# Patient Record
Sex: Male | Born: 1945
Health system: Southern US, Community
[De-identification: ages and names within clinical notes are randomized; demographics above are authoritative.]

## PROBLEM LIST (undated history)

## (undated) DIAGNOSIS — F329 Major depressive disorder, single episode, unspecified: Secondary | ICD-10-CM

## (undated) DIAGNOSIS — F419 Anxiety disorder, unspecified: Secondary | ICD-10-CM

## (undated) DIAGNOSIS — M1990 Unspecified osteoarthritis, unspecified site: Secondary | ICD-10-CM

## (undated) DIAGNOSIS — F32A Depression, unspecified: Secondary | ICD-10-CM

## (undated) DIAGNOSIS — K219 Gastro-esophageal reflux disease without esophagitis: Secondary | ICD-10-CM

## (undated) HISTORY — PX: TONSILLECTOMY: SUR1361

---
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
## 1982-01-29 HISTORY — PX: KNEE ARTHROSCOPY: SUR90

## 1999-04-05 ENCOUNTER — Encounter: Payer: Self-pay | Admitting: Family Medicine

## 1999-04-05 ENCOUNTER — Encounter: Admission: RE | Admit: 1999-04-05 | Discharge: 1999-04-05 | Payer: Self-pay | Admitting: Family Medicine

## 2010-01-29 HISTORY — PX: EYE SURGERY: SHX253

## 2010-01-29 HISTORY — PX: VEIN LIGATION AND STRIPPING: SHX2653

## 2010-01-29 HISTORY — PX: TURBINATE REDUCTION: SHX6157

## 2012-08-28 ENCOUNTER — Other Ambulatory Visit: Payer: Self-pay | Admitting: Orthopedic Surgery

## 2012-09-03 ENCOUNTER — Encounter (HOSPITAL_COMMUNITY): Payer: Self-pay | Admitting: Pharmacy Technician

## 2012-09-04 NOTE — Pre-Procedure Instructions (Signed)
Torell Minder  09/04/2012   Your procedure is scheduled on:  Monday, August 18th.  Report to [facility] at 5:30 AM.  Call this number if you have problems the morning of surgery: [phone]   Remember:   Do not eat food or drink liquids after midnight.   Take these medicines the morning of surgery with A SIP OF WATER: sertraline (ZOLOFT). Take if needed:HYDROcodone-acetaminophen (NORCO).   On Monday, August 11th, Stop taking Aspirin, Coumadin, Plavix, Effient and Herbal medications GLUCOSAMINE-CHONDROITIN.   Do not take any NSAIDs ie: Ibuprofen,  Advil,Naproxen or any medication containing Aspirin.     Do not wear jewelry, make-up or nail polish.  Do not wear lotions, powders, or perfumes. You may wear deodorant.  Do not shave 48 hours prior to surgery. Men may shave face and neck.  Do not bring valuables to the hospital.  [facility] is not responsible for any belongings or valuables.  Contacts, dentures or bridgework may not be worn into surgery.  Leave suitcase in the car. After surgery it may be brought to your room.  For patients admitted to the hospital, checkout time is 11:00 AM the day of discharge.     Special Instructions: Shower using CHG 2 nights before surgery and the night before surgery.  If you shower the day of surgery use CHG.  Use special wash - you have one bottle of CHG for all showers.  You should use approximately 1/3 of the bottle for each shower.   Please read over the following fact sheets that you were given: Pain Booklet, Coughing and Deep Breathing, Blood Transfusion Information and Surgical Site Infection Prevention

## 2012-09-05 ENCOUNTER — Encounter (HOSPITAL_COMMUNITY)
Admission: RE | Admit: 2012-09-05 | Discharge: 2012-09-05 | Disposition: A | Discharge status: Home / Self Care | Payer: Commercial Managed Care - PPO | Source: Ambulatory Visit | Attending: Orthopedic Surgery | Admitting: Orthopedic Surgery

## 2012-09-05 ENCOUNTER — Encounter (HOSPITAL_COMMUNITY): Payer: Self-pay

## 2012-09-05 DIAGNOSIS — Z01812 Encounter for preprocedural laboratory examination: Secondary | ICD-10-CM | POA: Insufficient documentation

## 2012-09-05 DIAGNOSIS — Z01818 Encounter for other preprocedural examination: Secondary | ICD-10-CM | POA: Insufficient documentation

## 2012-09-05 DIAGNOSIS — Z01810 Encounter for preprocedural cardiovascular examination: Secondary | ICD-10-CM | POA: Insufficient documentation

## 2012-09-05 HISTORY — DX: Unspecified osteoarthritis, unspecified site: M19.90

## 2012-09-05 LAB — COMPREHENSIVE METABOLIC PANEL
AST: 17 U/L (ref 0–37)
CO2: 28 mEq/L (ref 19–32)
Calcium: 9.7 mg/dL (ref 8.4–10.5)
Creatinine, Ser: 0.99 mg/dL (ref 0.50–1.35)
GFR calc Af Amer: >90 mL/min (ref >90)
GFR calc non Af Amer: 85 mL/min — ABNORMAL LOW (ref >90)

## 2012-09-05 LAB — CBC WITH DIFFERENTIAL/PLATELET
Basophils Absolute: 0 10*3/uL (ref 0.0–0.1)
Eosinophils Relative: 2 % (ref 0–5)
HCT: 43.2 % (ref 39.0–52.0)
Lymphocytes Relative: 23 % (ref 12–46)
MCH: 30.3 pg (ref 26.0–34.0)
MCHC: 35.2 g/dL (ref 30.0–36.0)
MCV: 86.2 fL (ref 78.0–100.0)
Monocytes Absolute: 0.6 10*3/uL (ref 0.1–1.0)
RDW: 12.6 % (ref 11.5–15.5)

## 2012-09-05 LAB — SURGICAL PCR SCREEN
MRSA, PCR: NEGATIVE
Staphylococcus aureus: NEGATIVE

## 2012-09-05 LAB — URINALYSIS, ROUTINE W REFLEX MICROSCOPIC
Ketones, ur: NEGATIVE mg/dL
Leukocytes, UA: NEGATIVE
Nitrite: NEGATIVE
Protein, ur: NEGATIVE mg/dL
Urobilinogen, UA: 1 mg/dL (ref 0.0–1.0)
pH: 6 (ref 5.0–8.0)

## 2012-09-05 LAB — PROTIME-INR: INR: 0.99 (ref 0.00–1.49)

## 2012-09-14 MED ORDER — DEXTROSE 5 % IV SOLN
3.0000 g | INTRAVENOUS | Status: AC
  Administered 2012-09-15: 3 g via INTRAVENOUS
  Filled 2012-09-14: qty 3000

## 2012-09-15 ENCOUNTER — Encounter (HOSPITAL_COMMUNITY): Payer: Self-pay | Admitting: Anesthesiology

## 2012-09-15 ENCOUNTER — Encounter (HOSPITAL_COMMUNITY)
Admission: RE | Disposition: A | Discharge status: Home Health | Payer: Self-pay | Source: Ambulatory Visit | Attending: Orthopedic Surgery

## 2012-09-15 ENCOUNTER — Inpatient Hospital Stay (HOSPITAL_COMMUNITY)
Admission: RE | Admit: 2012-09-15 | Discharge: 2012-09-17 | DRG: 470 | Disposition: A | Discharge status: Home Health | Payer: Commercial Managed Care - PPO | Source: Ambulatory Visit | Attending: Orthopedic Surgery | Admitting: Orthopedic Surgery

## 2012-09-15 ENCOUNTER — Encounter (HOSPITAL_COMMUNITY): Payer: Self-pay | Admitting: *Deleted

## 2012-09-15 ENCOUNTER — Inpatient Hospital Stay (HOSPITAL_COMMUNITY): Payer: Commercial Managed Care - PPO | Admitting: Anesthesiology

## 2012-09-15 DIAGNOSIS — Z7901 Long term (current) use of anticoagulants: Secondary | ICD-10-CM

## 2012-09-15 DIAGNOSIS — M1710 Unilateral primary osteoarthritis, unspecified knee: Principal | ICD-10-CM | POA: Diagnosis present

## 2012-09-15 DIAGNOSIS — Z79899 Other long term (current) drug therapy: Secondary | ICD-10-CM

## 2012-09-15 DIAGNOSIS — Z96651 Presence of right artificial knee joint: Secondary | ICD-10-CM

## 2012-09-15 DIAGNOSIS — D62 Acute posthemorrhagic anemia: Secondary | ICD-10-CM | POA: Diagnosis not present

## 2012-09-15 HISTORY — PX: TOTAL KNEE ARTHROPLASTY: SHX125

## 2012-09-15 LAB — CBC
MCH: 30 pg (ref 26.0–34.0)
MCV: 85.8 fL (ref 78.0–100.0)
Platelets: 174 10*3/uL (ref 150–400)
RBC: 4.43 MIL/uL (ref 4.22–5.81)

## 2012-09-15 SURGERY — ARTHROPLASTY, KNEE, TOTAL
Anesthesia: Regional | Site: Knee | Laterality: Right | Wound class: Clean

## 2012-09-15 MED ORDER — SENNOSIDES-DOCUSATE SODIUM 8.6-50 MG PO TABS: 1.0000 | ORAL_TABLET | Freq: Every evening | ORAL | Status: DC | PRN

## 2012-09-15 MED ORDER — 0.9 % SODIUM CHLORIDE (POUR BTL) OPTIME
TOPICAL | Status: DC | PRN
  Administered 2012-09-15: 1000 mL

## 2012-09-15 MED ORDER — MIDAZOLAM HCL 2 MG/2ML IJ SOLN
INTRAMUSCULAR | Status: AC
  Administered 2012-09-15: 2 mg via INTRAVENOUS
  Filled 2012-09-15: qty 2

## 2012-09-15 MED ORDER — PROPOFOL 10 MG/ML IV BOLUS
INTRAVENOUS | Status: DC | PRN
  Administered 2012-09-15: 200 mg via INTRAVENOUS

## 2012-09-15 MED ORDER — MIDAZOLAM HCL 2 MG/2ML IJ SOLN: 1.0000 mg | INTRAMUSCULAR | Status: DC | PRN

## 2012-09-15 MED ORDER — PHENYLEPHRINE HCL 10 MG/ML IJ SOLN
INTRAMUSCULAR | Status: DC | PRN
  Administered 2012-09-15: 120 ug via INTRAVENOUS
  Administered 2012-09-15 (×6): 80 ug via INTRAVENOUS

## 2012-09-15 MED ORDER — HYDROMORPHONE HCL PF 1 MG/ML IJ SOLN
1.0000 mg | INTRAMUSCULAR | Status: DC | PRN
  Administered 2012-09-15 – 2012-09-17 (×9): 1 mg via INTRAVENOUS
  Filled 2012-09-15 (×9): qty 1

## 2012-09-15 MED ORDER — ENOXAPARIN SODIUM 30 MG/0.3ML ~~LOC~~ SOLN
30.0000 mg | Freq: Two times a day (BID) | SUBCUTANEOUS | Status: DC
  Administered 2012-09-16 – 2012-09-17 (×3): 30 mg via SUBCUTANEOUS
  Filled 2012-09-15 (×5): qty 0.3

## 2012-09-15 MED ORDER — CEFAZOLIN SODIUM-DEXTROSE 2-3 GM-% IV SOLR
2.0000 g | Freq: Four times a day (QID) | INTRAVENOUS | Status: AC
  Administered 2012-09-15 (×2): 2 g via INTRAVENOUS
  Filled 2012-09-15 (×2): qty 50

## 2012-09-15 MED ORDER — CELECOXIB 200 MG PO CAPS
200.0000 mg | ORAL_CAPSULE | Freq: Two times a day (BID) | ORAL | Status: DC
  Administered 2012-09-15 – 2012-09-16 (×4): 200 mg via ORAL
  Filled 2012-09-15 (×6): qty 1

## 2012-09-15 MED ORDER — DIPHENHYDRAMINE HCL 12.5 MG/5ML PO ELIX: 12.5000 mg | ORAL_SOLUTION | ORAL | Status: DC | PRN

## 2012-09-15 MED ORDER — MEPERIDINE HCL 25 MG/ML IJ SOLN: 6.2500 mg | INTRAMUSCULAR | Status: DC | PRN

## 2012-09-15 MED ORDER — OXYCODONE HCL 5 MG PO TABS
ORAL_TABLET | ORAL | Status: AC
  Administered 2012-09-15: 5 mg
  Filled 2012-09-15: qty 1

## 2012-09-15 MED ORDER — OXYCODONE HCL 5 MG PO TABS
5.0000 mg | ORAL_TABLET | ORAL | Status: DC | PRN
  Administered 2012-09-15 – 2012-09-17 (×12): 10 mg via ORAL
  Filled 2012-09-15 (×12): qty 2

## 2012-09-15 MED ORDER — MIDAZOLAM HCL 2 MG/2ML IJ SOLN: 0.5000 mg | Freq: Once | INTRAMUSCULAR | Status: DC | PRN

## 2012-09-15 MED ORDER — METHOCARBAMOL 500 MG PO TABS
ORAL_TABLET | ORAL | Status: AC
  Administered 2012-09-15: 500 mg
  Filled 2012-09-15: qty 1

## 2012-09-15 MED ORDER — MIDAZOLAM HCL 5 MG/5ML IJ SOLN
INTRAMUSCULAR | Status: DC | PRN
  Administered 2012-09-15: 2 mg via INTRAVENOUS

## 2012-09-15 MED ORDER — HYDROMORPHONE HCL PF 1 MG/ML IJ SOLN
INTRAMUSCULAR | Status: AC
  Filled 2012-09-15: qty 1

## 2012-09-15 MED ORDER — ONDANSETRON HCL 4 MG PO TABS: 4.0000 mg | ORAL_TABLET | Freq: Four times a day (QID) | ORAL | Status: DC | PRN

## 2012-09-15 MED ORDER — ACETAMINOPHEN 650 MG RE SUPP: 650.0000 mg | Freq: Four times a day (QID) | RECTAL | Status: DC | PRN

## 2012-09-15 MED ORDER — SODIUM CHLORIDE 0.9 % IV SOLN: INTRAVENOUS | Status: DC

## 2012-09-15 MED ORDER — BUPIVACAINE ON-Q PAIN PUMP (FOR ORDER SET NO CHG)
INJECTION | Status: DC
  Filled 2012-09-15: qty 1

## 2012-09-15 MED ORDER — CELECOXIB 200 MG PO CAPS
ORAL_CAPSULE | ORAL | Status: AC
  Filled 2012-09-15: qty 1

## 2012-09-15 MED ORDER — ACETAMINOPHEN 325 MG PO TABS: 650.0000 mg | ORAL_TABLET | Freq: Four times a day (QID) | ORAL | Status: DC | PRN

## 2012-09-15 MED ORDER — FENTANYL CITRATE 0.05 MG/ML IJ SOLN: 50.0000 ug | INTRAMUSCULAR | Status: DC | PRN

## 2012-09-15 MED ORDER — BUPIVACAINE HCL (PF) 0.5 % IJ SOLN
INTRAMUSCULAR | Status: AC
  Filled 2012-09-15: qty 30

## 2012-09-15 MED ORDER — SODIUM CHLORIDE 0.9 % IJ SOLN
INTRAMUSCULAR | Status: DC | PRN
  Administered 2012-09-15: 10:00:00

## 2012-09-15 MED ORDER — ONDANSETRON HCL 4 MG/2ML IJ SOLN
INTRAMUSCULAR | Status: AC
  Administered 2012-09-15: 4 mg
  Filled 2012-09-15: qty 2

## 2012-09-15 MED ORDER — FENTANYL CITRATE 0.05 MG/ML IJ SOLN
INTRAMUSCULAR | Status: DC | PRN
  Administered 2012-09-15: 25 ug via INTRAVENOUS
  Administered 2012-09-15: 50 ug via INTRAVENOUS
  Administered 2012-09-15 (×3): 25 ug via INTRAVENOUS

## 2012-09-15 MED ORDER — SODIUM CHLORIDE 0.9 % IR SOLN
Status: DC | PRN
  Administered 2012-09-15: 3000 mL

## 2012-09-15 MED ORDER — ONDANSETRON HCL 4 MG/2ML IJ SOLN
INTRAMUSCULAR | Status: DC | PRN
  Administered 2012-09-15: 4 mg via INTRAVENOUS

## 2012-09-15 MED ORDER — LIDOCAINE HCL (CARDIAC) 20 MG/ML IV SOLN
INTRAVENOUS | Status: DC | PRN
  Administered 2012-09-15: 100 mg via INTRAVENOUS

## 2012-09-15 MED ORDER — BUPIVACAINE-EPINEPHRINE PF 0.25-1:200000 % IJ SOLN
INTRAMUSCULAR | Status: AC
  Filled 2012-09-15: qty 30

## 2012-09-15 MED ORDER — BUPIVACAINE HCL (PF) 0.5 % IJ SOLN
INTRAMUSCULAR | Status: DC | PRN
  Administered 2012-09-15: 30 mL

## 2012-09-15 MED ORDER — OXYCODONE HCL 5 MG/5ML PO SOLN: 5.0000 mg | Freq: Once | ORAL | Status: DC | PRN

## 2012-09-15 MED ORDER — DOCUSATE SODIUM 100 MG PO CAPS
100.0000 mg | ORAL_CAPSULE | Freq: Two times a day (BID) | ORAL | Status: DC
  Administered 2012-09-15 – 2012-09-16 (×3): 100 mg via ORAL
  Filled 2012-09-15 (×5): qty 1

## 2012-09-15 MED ORDER — LACTATED RINGERS IV SOLN
INTRAVENOUS | Status: DC
  Administered 2012-09-15 (×2): via INTRAVENOUS

## 2012-09-15 MED ORDER — SERTRALINE HCL 50 MG PO TABS
50.0000 mg | ORAL_TABLET | Freq: Every morning | ORAL | Status: DC
  Administered 2012-09-16: 50 mg via ORAL
  Filled 2012-09-15 (×3): qty 1

## 2012-09-15 MED ORDER — METOCLOPRAMIDE HCL 5 MG/ML IJ SOLN: 5.0000 mg | Freq: Three times a day (TID) | INTRAMUSCULAR | Status: DC | PRN

## 2012-09-15 MED ORDER — PHENOL 1.4 % MT LIQD: 1.0000 | OROMUCOSAL | Status: DC | PRN

## 2012-09-15 MED ORDER — ACETAMINOPHEN 500 MG PO TABS
1000.0000 mg | ORAL_TABLET | Freq: Once | ORAL | Status: AC
  Administered 2012-09-15: 1000 mg via ORAL
  Filled 2012-09-15: qty 2

## 2012-09-15 MED ORDER — METHOCARBAMOL 100 MG/ML IJ SOLN
500.0000 mg | Freq: Four times a day (QID) | INTRAVENOUS | Status: DC | PRN
  Filled 2012-09-15: qty 5

## 2012-09-15 MED ORDER — FENTANYL CITRATE 0.05 MG/ML IJ SOLN
INTRAMUSCULAR | Status: AC
  Administered 2012-09-15: 100 ug via INTRAVENOUS
  Filled 2012-09-15: qty 2

## 2012-09-15 MED ORDER — MENTHOL 3 MG MT LOZG: 1.0000 | LOZENGE | OROMUCOSAL | Status: DC | PRN

## 2012-09-15 MED ORDER — SODIUM CHLORIDE 0.9 % IV SOLN
INTRAVENOUS | Status: DC
  Administered 2012-09-15 – 2012-09-16 (×2): via INTRAVENOUS

## 2012-09-15 MED ORDER — HYDROMORPHONE HCL PF 1 MG/ML IJ SOLN
0.2500 mg | INTRAMUSCULAR | Status: DC | PRN
  Administered 2012-09-15: 1 mg via INTRAVENOUS
  Administered 2012-09-15 (×2): 0.5 mg via INTRAVENOUS

## 2012-09-15 MED ORDER — FLEET ENEMA 7-19 GM/118ML RE ENEM: 1.0000 | ENEMA | Freq: Once | RECTAL | Status: AC | PRN

## 2012-09-15 MED ORDER — ONDANSETRON HCL 4 MG/2ML IJ SOLN: 4.0000 mg | Freq: Four times a day (QID) | INTRAMUSCULAR | Status: DC | PRN

## 2012-09-15 MED ORDER — OXYCODONE HCL 5 MG PO TABS: 5.0000 mg | ORAL_TABLET | Freq: Once | ORAL | Status: DC | PRN

## 2012-09-15 MED ORDER — METOCLOPRAMIDE HCL 10 MG PO TABS: 5.0000 mg | ORAL_TABLET | Freq: Three times a day (TID) | ORAL | Status: DC | PRN

## 2012-09-15 MED ORDER — BUPIVACAINE-EPINEPHRINE PF 0.5-1:200000 % IJ SOLN
INTRAMUSCULAR | Status: DC | PRN
  Administered 2012-09-15: 30 mL

## 2012-09-15 MED ORDER — METHOCARBAMOL 500 MG PO TABS
500.0000 mg | ORAL_TABLET | Freq: Four times a day (QID) | ORAL | Status: DC | PRN
  Administered 2012-09-15 – 2012-09-17 (×5): 500 mg via ORAL
  Filled 2012-09-15 (×6): qty 1

## 2012-09-15 MED ORDER — ZOLPIDEM TARTRATE 5 MG PO TABS: 5.0000 mg | ORAL_TABLET | Freq: Every evening | ORAL | Status: DC | PRN

## 2012-09-15 MED ORDER — OXYCODONE HCL ER 20 MG PO T12A
20.0000 mg | EXTENDED_RELEASE_TABLET | Freq: Two times a day (BID) | ORAL | Status: DC
  Administered 2012-09-15 – 2012-09-16 (×4): 20 mg via ORAL
  Filled 2012-09-15 (×4): qty 1

## 2012-09-15 MED ORDER — BUPIVACAINE LIPOSOME 1.3 % IJ SUSP
20.0000 mL | Freq: Once | INTRAMUSCULAR | Status: DC
  Filled 2012-09-15: qty 20

## 2012-09-15 MED ORDER — ALUM & MAG HYDROXIDE-SIMETH 200-200-20 MG/5ML PO SUSP: 30.0000 mL | ORAL | Status: DC | PRN

## 2012-09-15 MED ORDER — PROMETHAZINE HCL 25 MG/ML IJ SOLN: 6.2500 mg | INTRAMUSCULAR | Status: DC | PRN

## 2012-09-15 MED ORDER — TRANEXAMIC ACID 100 MG/ML IV SOLN
1000.0000 mg | INTRAVENOUS | Status: AC
  Administered 2012-09-15: 1000 mg via INTRAVENOUS
  Filled 2012-09-15: qty 10

## 2012-09-15 MED ORDER — OXYCODONE HCL ER 20 MG PO T12A: 20.0000 mg | EXTENDED_RELEASE_TABLET | Freq: Two times a day (BID) | ORAL | Status: DC

## 2012-09-15 MED ORDER — BISACODYL 5 MG PO TBEC: 5.0000 mg | DELAYED_RELEASE_TABLET | Freq: Every day | ORAL | Status: DC | PRN

## 2012-09-15 SURGICAL SUPPLY — 55 items
BANDAGE ESMARK 6X9 LF (GAUZE/BANDAGES/DRESSINGS) ×1 IMPLANT
BLADE SAGITTAL 13X1.27X60 (BLADE) ×2 IMPLANT
BLADE SAW SGTL 83.5X18.5 (BLADE) ×2 IMPLANT
BNDG ESMARK 6X9 LF (GAUZE/BANDAGES/DRESSINGS) ×2
BOWL SMART MIX CTS (DISPOSABLE) ×2 IMPLANT
CAP POR TM CP VIT E LN CER HD ×2 IMPLANT
CEMENT BONE SIMPLEX SPEEDSET (Cement) ×4 IMPLANT
CLOTH BEACON ORANGE TIMEOUT ST (SAFETY) ×2 IMPLANT
COVER SURGICAL LIGHT HANDLE (MISCELLANEOUS) ×2 IMPLANT
CUFF TOURNIQUET SINGLE 34IN LL (TOURNIQUET CUFF) ×2 IMPLANT
DRAPE EXTREMITY T 121X128X90 (DRAPE) ×2 IMPLANT
DRAPE INCISE IOBAN 66X45 STRL (DRAPES) ×4 IMPLANT
DRAPE PROXIMA HALF (DRAPES) ×2 IMPLANT
DRAPE U-SHAPE 47X51 STRL (DRAPES) ×2 IMPLANT
DRSG ADAPTIC 3X8 NADH LF (GAUZE/BANDAGES/DRESSINGS) ×2 IMPLANT
DRSG PAD ABDOMINAL 8X10 ST (GAUZE/BANDAGES/DRESSINGS) ×2 IMPLANT
DURAPREP 26ML APPLICATOR (WOUND CARE) ×4 IMPLANT
ELECT REM PT RETURN 9FT ADLT (ELECTROSURGICAL) ×2
ELECTRODE REM PT RTRN 9FT ADLT (ELECTROSURGICAL) ×1 IMPLANT
EVACUATOR 1/8 PVC DRAIN (DRAIN) ×2 IMPLANT
GLOVE BIOGEL M 7.0 STRL (GLOVE) IMPLANT
GLOVE BIOGEL PI IND STRL 7.5 (GLOVE) IMPLANT
GLOVE BIOGEL PI IND STRL 8.5 (GLOVE) ×2 IMPLANT
GLOVE BIOGEL PI INDICATOR 7.5 (GLOVE)
GLOVE BIOGEL PI INDICATOR 8.5 (GLOVE) ×2
GLOVE SURG ORTHO 8.0 STRL STRW (GLOVE) ×4 IMPLANT
GOWN PREVENTION PLUS XLARGE (GOWN DISPOSABLE) ×4 IMPLANT
GOWN STRL NON-REIN LRG LVL3 (GOWN DISPOSABLE) ×4 IMPLANT
HANDPIECE INTERPULSE COAX TIP (DISPOSABLE) ×1
HOOD PEEL AWAY FACE SHEILD DIS (HOOD) ×8 IMPLANT
KIT BASIN OR (CUSTOM PROCEDURE TRAY) ×2 IMPLANT
KIT ROOM TURNOVER OR (KITS) ×2 IMPLANT
MANIFOLD NEPTUNE II (INSTRUMENTS) ×2 IMPLANT
NEEDLE 22X1 1/2 (OR ONLY) (NEEDLE) ×2 IMPLANT
NEEDLE HYPO 21X1.5 SAFETY (NEEDLE) ×2 IMPLANT
NS IRRIG 1000ML POUR BTL (IV SOLUTION) ×2 IMPLANT
PACK TOTAL JOINT (CUSTOM PROCEDURE TRAY) ×2 IMPLANT
PAD ARMBOARD 7.5X6 YLW CONV (MISCELLANEOUS) ×4 IMPLANT
PADDING CAST COTTON 6X4 STRL (CAST SUPPLIES) ×2 IMPLANT
POSITIONER HEAD PRONE TRACH (MISCELLANEOUS) ×2 IMPLANT
SET HNDPC FAN SPRY TIP SCT (DISPOSABLE) ×1 IMPLANT
SPONGE GAUZE 4X4 12PLY (GAUZE/BANDAGES/DRESSINGS) ×2 IMPLANT
STAPLER VISISTAT 35W (STAPLE) ×2 IMPLANT
SUCTION FRAZIER TIP 10 FR DISP (SUCTIONS) ×2 IMPLANT
SUT BONE WAX W31G (SUTURE) ×2 IMPLANT
SUT VIC AB 0 CTB1 27 (SUTURE) ×4 IMPLANT
SUT VIC AB 1 CT1 27 (SUTURE) ×2
SUT VIC AB 1 CT1 27XBRD ANBCTR (SUTURE) ×2 IMPLANT
SUT VIC AB 2-0 CT1 27 (SUTURE) ×2
SUT VIC AB 2-0 CT1 TAPERPNT 27 (SUTURE) ×2 IMPLANT
SYR CONTROL 10ML LL (SYRINGE) ×2 IMPLANT
TOWEL OR 17X24 6PK STRL BLUE (TOWEL DISPOSABLE) ×2 IMPLANT
TOWEL OR 17X26 10 PK STRL BLUE (TOWEL DISPOSABLE) ×2 IMPLANT
TRAY FOLEY CATH 16FRSI W/METER (SET/KITS/TRAYS/PACK) ×2 IMPLANT
WATER STERILE IRR 1000ML POUR (IV SOLUTION) ×4 IMPLANT

## 2012-09-15 NOTE — Evaluation (Signed)
Physical Therapy Evaluation Patient Details Name: Ronald Chaney MRN: [id number] DOB: 20-Feb-1945 Today's Date: 09/15/2012 Time: 4403-4742 PT Time Calculation (min): 23 min  PT Assessment / Plan / Recommendation History of Present Illness  Patient is a 67 yo male s/p Rt TKA.  Clinical Impression  Patient presents with problems listed below.  Patient will benefit from acute PT to maximize independence prior to discharge home with wife.    PT Assessment  Patient needs continued PT services    Follow Up Recommendations  Home health PT;Supervision/Assistance - 24 hour    Does the patient have the potential to tolerate intense rehabilitation      Barriers to Discharge        Equipment Recommendations  None recommended by PT    Recommendations for Other Services     Frequency 7X/week    Precautions / Restrictions Precautions Precautions: Knee Precaution Booklet Issued: Yes (comment) Precaution Comments: Reviewed precautions with patient and wife. Restrictions Weight Bearing Restrictions: Yes RLE Weight Bearing: Weight bearing as tolerated   Pertinent Vitals/Pain       Mobility  Bed Mobility Bed Mobility: Supine to Sit;Sitting - Scoot to Edge of Bed Supine to Sit: 4: Min assist;HOB flat Sitting - Scoot to [place] of Bed: 4: Min guard Details for Bed Mobility Assistance: Verbal cues for technique.  Assist to move RLE off of bed.  Cues to slow down for safety. Transfers Transfers: Sit to Stand;Stand to [employer] Transfers Sit to Stand: 4: Min assist;With upper extremity assist;From bed Stand to Sit: 4: Min assist;With upper extremity assist;With armrests;To chair/3-in-1 Stand Pivot Transfers: 4: Min assist Details for Transfer Assistance: Verbal cues for hand placement and technique.  Assist to rise to standing and for safety.  Patient able to take several steps to pivot to chair.  Cues to use UE's on RW to hold weight with RLE buckling.  Cues for hand placement to move  to sitting. Ambulation/Gait Ambulation/Gait Assistance: Not tested (comment)    Exercises Total Joint Exercises Ankle Circles/Pumps: AROM;Both;10 reps;Seated   PT Diagnosis: Difficulty walking;Acute pain  PT Problem List: Decreased strength;Decreased range of motion;Decreased activity tolerance;Decreased balance;Decreased knowledge of use of DME;Decreased knowledge of precautions;Pain PT Treatment Interventions: DME instruction;Gait training;Stair training;Functional mobility training;Therapeutic exercise;Patient/family education     PT Goals(Current goals can be found in the care plan section) Acute Rehab PT Goals Patient Stated Goal: To go home tomorrow PT Goal Formulation: With patient/family Time For Goal Achievement: 09/22/12 Potential to Achieve Goals: Good  Visit Information  Last PT Received On: 09/15/12 Assistance Needed: +1 History of Present Illness: Patient is a 67 yo male s/p Rt TKA.       Prior Functioning  Home Living Family/patient expects to be discharged to:: Private residence Living Arrangements: Spouse/significant other Available Help at Discharge: Family;Available 24 hours/day Type of Home: House Home Access: Stairs to enter [employer] of Steps: 12 (2 flights of 6 steps with landing to get to main floor) Entrance Stairs-Rails: Right Home Layout: Two level;Able to live on main level with bedroom/bathroom Home Equipment: Dan Humphreys - 2 wheels;Bedside commode Prior Function Level of Independence: Independent Communication Communication: No difficulties    Cognition  Cognition Arousal/Alertness: Awake/alert Behavior During Therapy: WFL for tasks assessed/performed (Slightly impulsive - cues to slow down) Overall Cognitive Status: Within Functional Limits for tasks assessed    Extremity/Trunk Assessment Upper Extremity Assessment Upper Extremity Assessment: Overall WFL for tasks assessed Lower Extremity Assessment Lower Extremity Assessment:  RLE deficits/detail RLE Deficits / Details:  Decreased strength and ROM due to surgery RLE: Unable to fully assess due to pain Cervical / Trunk Assessment Cervical / Trunk Assessment: Normal   Balance    End of Session PT - End of Session Equipment Utilized During Treatment: Gait belt Activity Tolerance: Patient limited by fatigue Patient left: in chair;with call bell/phone within reach;with family/visitor present Nurse Communication: Mobility status CPM Right Knee CPM Right Knee: Off (off at 15:55)  GP     Vena Austria 09/15/2012, 4:24 PM Durenda Hurt. Renaldo Fiddler, [facility] Pager [phone]

## 2012-09-15 NOTE — Anesthesia Procedure Notes (Addendum)
Anesthesia Regional Block:  Femoral nerve block  Pre-Anesthetic Checklist: ,, timeout performed, Correct Patient, Correct Site, Correct Laterality, Correct Procedure, Correct Position, site marked, Risks and benefits discussed,  Surgical consent,  Pre-op evaluation,  At surgeon's request and post-op pain management  Laterality: Right  Prep: chloraprep       Needles:  Injection technique: Single-shot  Needle Type: Stimulator Needle - 40     Needle Length: 4cm  Needle Gauge: 22 and 22 G    Additional Needles:  Procedures: nerve stimulator Femoral nerve block  Nerve Stimulator or Paresthesia:  Response: patella twitch, 0.44 mA, 0.1 ms,   Additional Responses:   Narrative:  Start time: 09/15/2012 8:11 AM End time: 09/15/2012 8:15 AM Injection made incrementally with aspirations every 5 mL.  Performed by: Personally  Anesthesiologist: Sandford Craze, MD  Additional Notes: Pt identified in Holding room.  Monitors applied. Working IV access confirmed. Sterile prep R groin.  #22ga PNS to patella twitch at 0.87mA threshold.  30cc 0.5% Bupivacaine with 1:200k epi injected incrementally after negative test dose.  Patient asymptomatic, VSS, no heme aspirated, tolerated well.  Sandford Craze, MD  Femoral nerve block Procedure Name: LMA Insertion Date/Time: 09/15/2012 9:12 AM Performed by: Jerilee Hoh Pre-anesthesia Checklist: Patient identified, Emergency Drugs available, Suction available and Patient being monitored Patient Re-evaluated:Patient Re-evaluated prior to inductionOxygen Delivery Method: Circle system utilized Preoxygenation: Pre-oxygenation with 100% oxygen Intubation Type: IV induction Ventilation: Mask ventilation without difficulty LMA: LMA inserted LMA Size: 4.0 Number of attempts: 1 Placement Confirmation: positive ETCO2 and breath sounds checked- equal and bilateral Tube secured with: Tape Dental Injury: Teeth and Oropharynx as per pre-operative assessment

## 2012-09-15 NOTE — Progress Notes (Signed)
UR COMPLETED  

## 2012-09-15 NOTE — Preoperative (Signed)
Beta Blockers   Reason not to administer Beta Blockers:Not Applicable 

## 2012-09-15 NOTE — Transfer of Care (Signed)
Immediate Anesthesia Transfer of Care Note  Patient: Ronald Chaney  Procedure(s) Performed: Procedure(s): TOTAL KNEE ARTHROPLASTY- right (Right)  Patient Location: PACU  Anesthesia Type:General  Level of Consciousness: awake, alert , oriented and patient cooperative  Airway & Oxygen Therapy: Patient Spontanous Breathing and Patient connected to face mask oxygen  Post-op Assessment: Report given to PACU RN, Post -op Vital signs reviewed and stable and Patient moving all extremities  Post vital signs: Reviewed and stable  Complications: No apparent anesthesia complications

## 2012-09-15 NOTE — Progress Notes (Signed)
Orthopedic Tech Progress Note Patient Details:  Ronald Chaney 05/15/1945 [id number] Applied CPM to RLE.   CPM Right Knee CPM Right Knee: On Right Knee Flexion (Degrees): 90 Right Knee Extension (Degrees): 0   [name] 09/15/2012, 2:02 PM

## 2012-09-15 NOTE — Plan of Care (Signed)
Problem: Consults Goal: Diagnosis- Total Joint Replacement Primary Total Knee Right     

## 2012-09-15 NOTE — H&P (Signed)
  Ronald Chaney MRN:  [id number] DOB/SEX:  1946/01/02/male  CHIEF COMPLAINT:  Painful right Knee  HISTORY: Patient is a 67 y.o. male presented with a history of pain in the left knee. Onset of symptoms was gradual starting several years ago with gradually worsening course since that time. Prior procedures on the knee include arthroscopy. Patient has been treated conservatively with over-the-counter NSAIDs and activity modification. Patient currently rates pain in the knee at 9 out of 10 with activity. There is pain at night.  PAST MEDICAL HISTORY: There are no active problems to display for this patient.  Past Medical History  Diagnosis Date  . Arthritis    Past Surgical History  Procedure Laterality Date  . Vein ligation and stripping Bilateral 2012  . Eye surgery Right 2012    Laser- glucoma  . Turbinate reduction  2012  . Knee arthroscopy Bilateral 1984  . Knee surgery Right 1967    cartlige     MEDICATIONS:   Prescriptions prior to admission  Medication Sig Dispense Refill  . GLUCOSAMINE-CHONDROITIN PO Take 1 tablet by mouth See admin instructions. Patient takes one tablet every day. He states he may take an additional tablet daily.      Marland Kitchen HYDROcodone-acetaminophen (NORCO) 10-325 MG per tablet Take 1 tablet by mouth 2 (two) times daily as needed for pain.      Marland Kitchen ibuprofen (ADVIL,MOTRIN) 200 MG tablet Take 600 mg by mouth at bedtime as needed for pain.      . Multiple Vitamins-Minerals (ICAPS PO) Take 1 tablet by mouth 2 (two) times daily.      . sertraline (ZOLOFT) 50 MG tablet Take 50 mg by mouth every morning.        ALLERGIES:  No Known Allergies  REVIEW OF SYSTEMS:  Pertinent items are noted in HPI.   FAMILY HISTORY:  No family history on file.  SOCIAL HISTORY:   History  Substance Use Topics  . Smoking status: Never Smoker   . Smokeless tobacco: Not on file  . Alcohol Use: Not on file     EXAMINATION:  Vital signs in last 24 hours:    General  appearance: alert, cooperative and no distress Lungs: clear to auscultation bilaterally Heart: regular rate and rhythm, S1, S2 normal, no murmur, click, rub or gallop Abdomen: soft, non-tender; bowel sounds normal; no masses,  no organomegaly Extremities: extremities normal, atraumatic, no cyanosis or edema and Homans sign is negative, no sign of DVT Pulses: 2+ and symmetric Skin: Skin color, texture, turgor normal. No rashes or lesions  Musculoskeletal:  ROM 0-115, Ligaments intact,  Imaging Review Plain radiographs demonstrate severe degenerative joint disease of the right knee. The overall alignment is mild valgus. The bone quality appears to be excellent for age and reported activity level.  Assessment/Plan: End stage arthritis, right knee   The patient history, physical examination and imaging studies are consistent with advanced degenerative joint disease of the right knee. The patient has failed conservative treatment.  The clearance notes were reviewed.  After discussion with the patient it was felt that Total Knee Replacement was indicated. The procedure,  risks, and benefits of total knee arthroplasty were presented and reviewed. The risks including but not limited to aseptic loosening, infection, blood clots, vascular injury, stiffness, patella tracking problems complications among others were discussed. The patient acknowledged the explanation, agreed to proceed with the plan.  Ronald Chaney 09/15/2012, 6:58 AM

## 2012-09-15 NOTE — Anesthesia Preprocedure Evaluation (Signed)
Anesthesia Evaluation  Patient identified by MRN, date of birth, ID band Patient awake    Reviewed: Allergy & Precautions, H&P , NPO status , Patient's Chart, lab work & pertinent test results  History of Anesthesia Complications Negative for: history of anesthetic complications  Airway Mallampati: II TM Distance: >3 FB Neck ROM: Full    Dental  (+) Poor Dentition, Caps and Dental Advisory Given   Pulmonary neg pulmonary ROS,  breath sounds clear to auscultation  Pulmonary exam normal       Cardiovascular negative cardio ROS  Rhythm:Regular Rate:Normal     Neuro/Psych negative neurological ROS  negative psych ROS   GI/Hepatic Neg liver ROS, GERD-  Controlled,  Endo/Other  negative endocrine ROS  Renal/GU negative Renal ROS     Musculoskeletal  (+) Arthritis -,   Abdominal   Peds  Hematology negative hematology ROS (+)   Anesthesia Other Findings   Reproductive/Obstetrics                           Anesthesia Physical Anesthesia Plan  ASA: II  Anesthesia Plan: General   Post-op Pain Management:    Induction: Intravenous  Airway Management Planned: LMA  Additional Equipment:   Intra-op Plan:   Post-operative Plan:   Informed Consent: I have reviewed the patients History and Physical, chart, labs and discussed the procedure including the risks, benefits and alternatives for the proposed anesthesia with the patient or authorized representative who has indicated his/her understanding and acceptance.   Dental advisory given  Plan Discussed with: CRNA and Surgeon  Anesthesia Plan Comments: (Plan routine monitors, GA- LMA OK, femoral nerve block for post op analgesia )        Anesthesia Quick Evaluation

## 2012-09-16 LAB — CBC
HCT: 32.8 % — ABNORMAL LOW (ref 39.0–52.0)
Hemoglobin: 11.2 g/dL — ABNORMAL LOW (ref 13.0–17.0)
MCH: 29.7 pg (ref 26.0–34.0)
RBC: 3.77 MIL/uL — ABNORMAL LOW (ref 4.22–5.81)

## 2012-09-16 LAB — BASIC METABOLIC PANEL
CO2: 28 mEq/L (ref 19–32)
Calcium: 8.4 mg/dL (ref 8.4–10.5)
Glucose, Bld: 123 mg/dL — ABNORMAL HIGH (ref 70–99)
Sodium: 137 mEq/L (ref 135–145)

## 2012-09-16 MED ORDER — ENOXAPARIN SODIUM 40 MG/0.4ML ~~LOC~~ SOLN: 40.0000 mg | SUBCUTANEOUS | Status: DC

## 2012-09-16 MED ORDER — HYDROMORPHONE HCL 2 MG PO TABS: 2.0000 mg | ORAL_TABLET | ORAL | Status: DC | PRN

## 2012-09-16 MED ORDER — METHOCARBAMOL 500 MG PO TABS: 500.0000 mg | ORAL_TABLET | Freq: Four times a day (QID) | ORAL | Status: DC | PRN

## 2012-09-16 MED ORDER — OXYCODONE HCL 5 MG PO TABS: 5.0000 mg | ORAL_TABLET | ORAL | Status: DC | PRN

## 2012-09-16 MED ORDER — OXYCODONE HCL ER 20 MG PO T12A: 20.0000 mg | EXTENDED_RELEASE_TABLET | Freq: Two times a day (BID) | ORAL | Status: DC

## 2012-09-16 NOTE — Discharge Summary (Signed)
SPORTS MEDICINE & JOINT REPLACEMENT   Georgena Spurling, MD   Altamese Cabal, PA-C [address] [place], [place], [place]  [address]                             [phone]  PATIENT ID: Ronald Chaney        MRN:  [id number]          DOB/AGE: September 01, 1945 / 67 y.o.    DISCHARGE SUMMARY  ADMISSION DATE:    09/15/2012 DISCHARGE DATE:   09/16/2012   ADMISSION DIAGNOSIS: osteoarthritis right knee    DISCHARGE DIAGNOSIS:  osteoarthritis right knee    ADDITIONAL DIAGNOSIS: Active Problems:   * No active hospital problems. *  Past Medical History  Diagnosis Date  . Arthritis     PROCEDURE: Procedure(s): TOTAL KNEE ARTHROPLASTY- right on 09/15/2012  CONSULTS:     HISTORY:  See H&P in chart  HOSPITAL COURSE:  Ronald Chaney is a 67 y.o. admitted on 09/15/2012 and found to have a diagnosis of osteoarthritis right knee.  After appropriate laboratory studies were obtained  they were taken to the operating room on 09/15/2012 and underwent Procedure(s): TOTAL KNEE ARTHROPLASTY- right.   They were given perioperative antibiotics:  Anti-infectives   Start     Dose/Rate Route Frequency Ordered Stop   09/15/12 1600  ceFAZolin (ANCEF) IVPB 2 g/50 mL premix     2 g 100 mL/hr over 30 Minutes Intravenous Every 6 hours 09/15/12 1420 09/15/12 2230   09/15/12 0600  ceFAZolin (ANCEF) 3 g in dextrose 5 % 50 mL IVPB     3 g 160 mL/hr over 30 Minutes Intravenous On call to O.R. 09/14/12 1244 09/15/12 0919    .  Tolerated the procedure well.  Placed with a foley intraoperatively.  Given Ofirmev at induction and for 48 hours.    POD# 1: Vital signs were stable.  Patient denied Chest pain, shortness of breath, or calf pain.  Patient was started on Lovenox 30 mg subcutaneously twice daily at 8am.  Consults to PT, OT, and care management were made.  The patient was weight bearing as tolerated.  CPM was placed on the operative leg 0-90 degrees for 6-8 hours a day.  Incentive spirometry was taught.  Dressing was  changed.  Marcaine pump and hemovac were discontinued.      POD #2, Continued  PT for ambulation and exercise program.  IV saline locked.  O2 discontinued.    The remainder of the hospital course was dedicated to ambulation and strengthening.   The patient was discharged on 1 Day Post-Op in  Good condition.  Blood products given:none  DIAGNOSTIC STUDIES: Recent vital signs: Patient Vitals for the past 24 hrs:  BP Temp Pulse Resp SpO2  09/16/12 1600 - - - 16 -  09/16/12 1200 - - - 18 -  09/16/12 0800 - - - 18 -  09/16/12 0547 127/68 mmHg 98.8 F (37.1 C) 88 16 96 %  09/16/12 0400 - - - 18 97 %  09/16/12 0209 100/66 mmHg 98.8 F (37.1 C) 96 16 98 %  09/16/12 0000 - - - 17 95 %  09/15/12 2035 127/77 mmHg 98.7 F (37.1 C) 83 16 97 %  09/15/12 2000 - - - 18 96 %       Recent laboratory studies:  Recent Labs  09/15/12 1503 09/16/12 0415  WBC 10.7* 7.4  HGB 13.3 11.2*  HCT 38.0* 32.8*  PLT 174 171    Recent Labs  09/15/12 1503 09/16/12 0415  NA  --  137  K  --  3.6  CL  --  102  CO2  --  28  BUN  --  7  CREATININE 0.80 0.92  GLUCOSE  --  123*  CALCIUM  --  8.4   Lab Results  Component Value Date   INR 0.99 09/05/2012     Recent Radiographic Studies :  Dg Chest 2 View  09/05/2012   *RADIOLOGY REPORT*  Clinical Data: Preop  CHEST - 2 VIEW  Comparison: None.  Findings: Cardiomediastinal silhouette is unremarkable.  No acute infiltrate or pleural effusion.  No pulmonary edema.  Mild degenerative changes lower thoracic spine.  IMPRESSION:  No active disease.  Mild degenerative changes lower thoracic spine.   Original Report Authenticated By: Natasha Mead, M.D.    DISCHARGE INSTRUCTIONS: Discharge Orders   Future Orders Complete By Expires   Call MD / Call 911  As directed    Comments:     If you experience chest pain or shortness of breath, CALL 911 and be transported to the hospital emergency room.  If you develope a fever above 101 F, pus (white drainage) or  increased drainage or redness at the wound, or calf pain, call your surgeon's office.   Change dressing  As directed    Comments:     Change dressing on thursday, then change the dressing daily with sterile 4 x 4 inch gauze dressing and apply TED hose.   Constipation Prevention  As directed    Comments:     Drink plenty of fluids.  Prune juice may be helpful.  You may use a stool softener, such as Colace (over the counter) 100 mg twice a day.  Use MiraLax (over the counter) for constipation as needed.   CPM  As directed    Comments:     Continuous passive motion machine (CPM):      Use the CPM from 0 to 90 for 6-8 hours per day.      You may increase by 10 per day.  You may break it up into 2 or 3 sessions per day.      Use CPM for 2 weeks or until you are told to stop.   Diet - low sodium heart healthy  As directed    Do not put a pillow under the knee. Place it under the heel.  As directed    Driving restrictions  As directed    Comments:     No driving for 6 weeks   Increase activity slowly as tolerated  As directed    Lifting restrictions  As directed    Comments:     No lifting for 6 weeks   TED hose  As directed    Comments:     Use stockings (TED hose) for 3 weeks on both leg(s).  You may remove them at night for sleeping.      DISCHARGE MEDICATIONS:     Medication List    STOP taking these medications       HYDROcodone-acetaminophen 10-325 MG per tablet  Commonly known as:  NORCO      TAKE these medications       enoxaparin 40 MG/0.4ML injection  Commonly known as:  LOVENOX  Inject 0.4 mL (40 mg total) into the skin daily.     GLUCOSAMINE-CHONDROITIN PO  Take 1 tablet by mouth See admin instructions. Patient takes  one tablet every day. He states he may take an additional tablet daily.     HYDROmorphone 2 MG tablet  Commonly known as:  DILAUDID  Take 1 tablet (2 mg total) by mouth every 4 (four) hours as needed for pain.     ibuprofen 200 MG tablet  Commonly  known as:  ADVIL,MOTRIN  Take 600 mg by mouth at bedtime as needed for pain.     ICAPS PO  Take 1 tablet by mouth 2 (two) times daily.     methocarbamol 500 MG tablet  Commonly known as:  ROBAXIN  Take 1-2 tablets (500-1,000 mg total) by mouth every 6 (six) hours as needed.     oxyCODONE 5 MG immediate release tablet  Commonly known as:  Oxy IR/ROXICODONE  Take 1-2 tablets (5-10 mg total) by mouth every 3 (three) hours as needed.     OxyCODONE 20 mg T12a 12 hr tablet  Commonly known as:  OXYCONTIN  Take 1 tablet (20 mg total) by mouth every 12 (twelve) hours.     sertraline 50 MG tablet  Commonly known as:  ZOLOFT  Take 50 mg by mouth every morning.        FOLLOW UP VISIT:       Follow-up Information   Follow up with Raymon Mutton, MD. Call on 09/30/2012. [facility] [phone] home physical therapy, they will call you )    Specialty:  Orthopedic Surgery   Contact information:   [address] [place] [address] [phone]       DISPOSITION: HOME   CONDITION:  Good   Ronald Chaney 09/16/2012, 5:22 PM

## 2012-09-16 NOTE — Progress Notes (Signed)
Physical Therapy Treatment Patient Details Name: Ronald Chaney MRN: [id number] DOB: Apr 22, 1945 Today's Date: 09/16/2012 Time: 6213-0865 PT Time Calculation (min): 29 min  PT Assessment / Plan / Recommendation  History of Present Illness Patient is a 67 yo male s/p Rt TKA.   PT Comments   Patient progressing with ambulation this morning. Wants to try steps this afternoon and possibly DC. Will attempt steps this afternoon. Patient has 2 sets of 8 steps.   Follow Up Recommendations  Home health PT;Supervision/Assistance - 24 hour     Does the patient have the potential to tolerate intense rehabilitation     Barriers to Discharge        Equipment Recommendations  None recommended by PT    Recommendations for Other Services    Frequency 7X/week   Progress towards PT Goals Progress towards PT goals: Progressing toward goals  Plan Current plan remains appropriate    Precautions / Restrictions Precautions Precautions: Knee Restrictions RLE Weight Bearing: Weight bearing as tolerated   Pertinent Vitals/Pain no apparent distress     Mobility  Bed Mobility Supine to Sit: 5: Supervision Details for Bed Mobility Assistance: Hooked R LE with LLE for OOB Transfers Sit to Stand: 4: Min guard;With upper extremity assist;From bed Stand to Sit: 4: Min guard;With upper extremity assist;To chair/3-in-1 Details for Transfer Assistance: VCs for safe hand placement Ambulation/Gait Ambulation/Gait Assistance: 4: Min guard Ambulation Distance (Feet): 90 Feet Assistive device: Rolling walker Ambulation/Gait Assistance Details: Cues for sequency, WBAT and RW management Gait Pattern: Step-to pattern    Exercises Total Joint Exercises Quad Sets: AROM;Right;10 reps Heel Slides: AAROM;Right;10 reps Hip ABduction/ADduction: AAROM;Right;10 reps Straight Leg Raises: AAROM;Right;10 reps Long Arc Quad: AAROM;Right;10 reps   PT Diagnosis:    PT Problem List:   PT Treatment Interventions:      PT Goals (current goals can now be found in the care plan section)    Visit Information  Last PT Received On: 09/16/12 Assistance Needed: +1 History of Present Illness: Patient is a 67 yo male s/p Rt TKA.    Subjective Data      Cognition  Cognition Arousal/Alertness: Awake/alert Behavior During Therapy: WFL for tasks assessed/performed Overall Cognitive Status: Within Functional Limits for tasks assessed    Balance     End of Session PT - End of Session Equipment Utilized During Treatment: Gait belt Activity Tolerance: Patient tolerated treatment well Patient left: in chair;with call bell/phone within reach Nurse Communication: Mobility status   GP     Fredrich Birks 09/16/2012, 12:13 PM  09/16/2012 Fredrich Birks PTA [phone] pager [phone] office

## 2012-09-16 NOTE — Op Note (Signed)
TOTAL KNEE REPLACEMENT OPERATIVE NOTE:  09/15/2012  11:26 AM  PATIENT:  Ronald Chaney  67 y.o. male  PRE-OPERATIVE DIAGNOSIS:  osteoarthritis right knee  POST-OPERATIVE DIAGNOSIS:  osteoarthritis right knee  PROCEDURE:  Procedure(s): TOTAL KNEE ARTHROPLASTY- right  SURGEON:  Surgeon(s): Dannielle Huh, MD  PHYSICIAN ASSISTANT: Altamese Cabal, [facility]  ANESTHESIA:   general  DRAINS: Hemovac and On-Q Marcaine Pain Pump  SPECIMEN: None  COUNTS:  Correct  TOURNIQUET:   Total Tourniquet Time Documented: Thigh (Right) - 69 minutes Total: Thigh (Right) - 69 minutes   DICTATION:  Indication for procedure:    The patient is a 67 y.o. male who has failed conservative treatment for osteoarthritis right knee.  Informed consent was obtained prior to anesthesia. The risks versus benefits of the operation were explain and in a way the patient can, and did, understand.   Description of procedure:     The patient was taken to the operating room and placed under anesthesia.  The patient was positioned in the usual fashion taking care that all body parts were adequately padded and/or protected.  I foley catheter was not placed.  A tourniquet was applied and the leg prepped and draped in the usual sterile fashion.  The extremity was exsanguinated with the esmarch and tourniquet inflated to 350 mmHg.  Pre-operative range of motion was normal.  The knee was in 5 degree of mild varus.  A midline incision approximately 6-7 inches long was made with a #10 blade.  A new blade was used to make a parapatellar arthrotomy going 2-3 cm into the quadriceps tendon, over the patella, and alongside the medial aspect of the patellar tendon.  A synovectomy was then performed with the #10 blade and forceps. I then elevated the deep MCL off the medial tibial metaphysis subperiosteally around to the semimembranosus attachment.    I everted the patella and used calipers to measure patellar thickness.  I used the reamer  to ream down to appropriate thickness to recreate the native thickness.  I then removed excess bone with the rongeur and sagittal saw.  I used the appropriately sized template and drilled the three lug holes.  I then put the trial in place and measured the thickness with the calipers to ensure recreation of the native thickness.  The trial was then removed and the patella subluxed and the knee brought into flexion.  A homan retractor was place to retract and protect the patella and lateral structures.  A Z-retractor was place medially to protect the medial structures.  The extra-medullary alignment system was used to make cut the tibial articular surface perpendicular to the anamotic axis of the tibia and in 3 degrees of posterior slope.  The cut surface and alignment jig was removed.  I then used the intramedullary alignment guide to make a 6 valgus cut on the distal femur.  I then marked out the epicondylar axis on the distal femur.  The posterior condylar axis measured 5 degrees.  I then used the anterior referencing sizer and measured the femur to be a size 10. The 4-In-1 cutting block was screwed into place in external rotation matching the posterior condylar angle, making our cuts perpendicular to the epicondylar axis.  Anterior, posterior and chamfer cuts were made with the sagittal saw.  The cutting block and cut pieces were removed.  A lamina spreader was placed in 90 degrees of flexion.  The ACL, PCL, menisci, and posterior condylar osteophytes were removed.  A 10 mm spacer blocked  was found to offer good flexion and extension gap balance after minimal in degree releasing.   The scoop retractor was then placed and the femoral finishing block was pinned in place.  The small sagittal saw was used as well as the lug drill to finish the femur.  The block and cut surfaces were removed and the medullary canal hole filled with autograft bone from the cut pieces.  The tibia was delivered forward in deep  flexion and external rotation.  A size F tray was selected and pinned into place centered on the medial 1/3 of the tibial tubercle.  The reamer and keel was used to prepare the tibia through the tray.    I then trialed with the size 10 femur, size F tibia, a 10 mm insert and the 35 patella.  I had excellent flexion/extension gap balance, excellent patella tracking.  Flexion was full and beyond 120 degrees; extension was zero.  These components were chosen and the staff opened them to me on the back table while the knee was lavaged copiously and the cement mixed.  I cemented in the components and removed all excess cement.  The polyethylene tibial component was snapped into place and the knee placed in extension while cement was hardening.  The capsule was infilltrated with 20cc of .25% Marcaine with epinephrine.  A hemovac was place in the joint exiting superolaterally.  A pain pump was place superomedially superficial to the arthrotomy.  Once the cement was hard, the tourniquet was let down.  Hemostasis was obtained.  The arthrotomy was closed with figure-8 #1 vicryl sutures.  The deep soft tissues were closed with #0 vicryls and the subcuticular layer closed with a running #2-0 vicryl.  The skin was reapproximated and closed with skin staples.  The wound was dressed with xeroform, 4 x4's, 2 ABD sponges, a single layer of webril and a TED stocking.   The patient was then awakened, extubated, and taken to the recovery room in stable condition.  BLOOD LOSS:  300cc DRAINS: 1 hemovac, 1 pain catheter COMPLICATIONS:  None.  PLAN OF CARE: Admit to inpatient   PATIENT DISPOSITION:  PACU - hemodynamically stable.   Delay start of Pharmacological VTE agent (>24hrs) due to surgical blood loss or risk of bleeding:  not applicable  Please fax a copy of this op note to my office at [phone] (please only include page 1 and 2 of the Case Information op note)

## 2012-09-16 NOTE — Evaluation (Addendum)
Occupational Therapy Evaluation Patient Details Name: Ronald Chaney MRN: [id number] DOB: October 04, 1945 Today's Date: 09/16/2012 Time: 4098-1191 OT Time Calculation (min): 31 min  OT Assessment / Plan / Recommendation History of present illness Patient is a 67 yo male s/p Rt TKA.   Clinical Impression   Pt did well during session. OT provided education and pt practiced ADLs. Spouse present for session. Feel pt is safe to d/c from OT standpoint with wife available to assist 24/7. Pt has steps to accomplish with PT before d/c as he lives in a two story house.      OT Assessment  Patient does not need any further OT services    Follow Up Recommendations  No OT follow up;Supervision/Assistance - 24 hour    Barriers to Discharge      Equipment Recommendations  None recommended by OT    Recommendations for Other Services    Frequency       Precautions / Restrictions Precautions Precautions: Knee Precaution Booklet Issued: No Precaution Comments: Reviewed precautions with patient  Restrictions Weight Bearing Restrictions: Yes RLE Weight Bearing: Weight bearing as tolerated   Pertinent Vitals/Pain Pain 4/10. Repositioned.     ADL  Eating/Feeding: Independent Where Assessed - Eating/Feeding: Chair Grooming: Min guard;Wash/dry face Where Assessed - Grooming: Supported standing Upper Body Bathing: Set up;Supervision/safety Where Assessed - Upper Body Bathing: Unsupported sitting Lower Body Bathing: Min guard Where Assessed - Lower Body Bathing: Supported sit to stand Upper Body Dressing: Set up;Supervision/safety Where Assessed - Upper Body Dressing: Unsupported sitting Lower Body Dressing: Min guard Where Assessed - Lower Body Dressing: Supported sit to Pharmacist, hospital: Min Pension scheme manager Method: Sit to Barista: Raised toilet seat with arms (or 3-in-1 over toilet) Toileting - Clothing Manipulation and Hygiene: Min guard Where Assessed -  Toileting Clothing Manipulation and Hygiene: Sit to stand from 3-in-1 or toilet Tub/Shower Transfer: Simulated;Min guard Tub/Shower Transfer Method: Science writer: Walk in shower;Other (comment) (3 in 1; also practiced simulated tub transfer) Equipment Used: Gait belt;Rolling walker Transfers/Ambulation Related to ADLs: Min Guard for ambulation and transfers. ADL Comments: Pt overall Min guard level for LB ADLs. Pt able to reach right foot to don/doff sock. Pt practiced simulated shower and tub transfer-Min guard level with cues for technique. Pt knee buckling slightly a few times. OT educated not to attempt stepping into shower if knee is still buckling. OT also educated to ask [facility] to address tub/shower transfer before they do it at home to be safe. OT educated on dressing technique and to stand in front of chair/bed when pulling up pants/underwear with walker in front and wife with him. Educated to have someone with him 24/7.  Also educated to have walker in front when performing hygiene after toileting. Educated to sit to bathe and when standing to complete washing, have walker in front.    OT Diagnosis:    OT Problem List:   OT Treatment Interventions:     OT Goals(Current goals can be found in the care plan section) Acute Rehab OT Goals Patient Stated Goal: To walk with more ease.  Visit Information  Last OT Received On: 09/16/12 Assistance Needed: +1 History of Present Illness: Patient is a 67 yo male s/p Rt TKA.       Prior Functioning     Home Living Family/patient expects to be discharged to:: Private residence Living Arrangements: Spouse/significant other Available Help at Discharge: Family;Available 24 hours/day Type of Home: House Home Access: Stairs to  enter Entrance Stairs-Number of Steps: 12 (2 flights of 6 steps with landing to get to main floor) Entrance Stairs-Rails: Right Home Layout: Two level Home Equipment: Walker - 2 wheels;Bedside  commode Prior Function Level of Independence: Independent Communication Communication: No difficulties         Vision/Perception     Cognition  Cognition Arousal/Alertness: Awake/alert Behavior During Therapy: WFL for tasks assessed/performed Overall Cognitive Status: Within Functional Limits for tasks assessed    Extremity/Trunk Assessment Upper Extremity Assessment Upper Extremity Assessment: Overall WFL for tasks assessed     Mobility Bed Mobility Bed Mobility: Not assessed Transfers Transfers: Sit to Stand;Stand to Sit Sit to Stand: 4: Min guard;With upper extremity assist;From chair/3-in-1 Stand to Sit: 4: Min guard;With upper extremity assist;To chair/3-in-1 Details for Transfer Assistance: cues for hand placement        Balance     End of Session OT - End of Session Equipment Utilized During Treatment: Gait belt;Rolling walker Activity Tolerance: Patient tolerated treatment well Patient left: in chair;with call bell/phone within reach;with family/visitor present CPM Right Knee CPM Right Knee: Off  GO      Earlie Raveling OTR/L [phone] 09/16/2012, 1:23 PM

## 2012-09-16 NOTE — Discharge Instructions (Signed)
Diet: As you were doing prior to hospitalization   Activity:  Increase activity slowly as tolerated                  No lifting or driving for 6 weeks  Shower:  May shower without a dressing once there is no drainage from your wound.                 Do NOT wash over the wound.                 Dressing:  You may change your dressing on Thursday                    Then change the dressing daily with sterile 4"x4"s gauze dressing                     And TED hose for knees.  Weight Bearing:  Weight bearing as tolerated as taught in physical therapy.  Use a                                walker or Crutches as instructed.  To prevent constipation: you may use a stool softener such as -               Colace ( over the counter) 100 mg by mouth twice a day                Drink plenty of fluids ( prune juice may be helpful) and high fiber foods                Miralax ( over the counter) for constipation as needed.    Precautions:  If you experience chest pain or shortness of breath - call 911 immediately               For transfer to the hospital emergency department!!               If you develop a fever greater that 101 F, purulent drainage from wound,                             increased redness or drainage from wound, or calf pain -- Call the office.  Follow- Up Appointment:  Please call for an appointment to be seen on 09/30/12                                              [facility] - [phone]

## 2012-09-16 NOTE — Progress Notes (Signed)
Physical Therapy Treatment Patient Details Name: Ronald Chaney MRN: [id number] DOB: October 08, 1945 Today's Date: 09/16/2012 Time: 4098-1191 PT Time Calculation (min): 27 min  PT Assessment / Plan / Recommendation  History of Present Illness Patient is a 67 yo male s/p Rt TKA.   PT Comments   Patient able to practice stair training this session. Patient required Min A for balance as he buckled on step. Patient was attempting to go home today however patient does have about 16 steps to get to the level that he needs to be one. At this time recommending patient stay another day to practice steps again tomorrow and increasing endurance level prior to DC home. Will notify RN and PA. Hemovac came undone during transfer. Rn aware as well  Follow Up Recommendations  Home health PT;Supervision/Assistance - 24 hour     Does the patient have the potential to tolerate intense rehabilitation     Barriers to Discharge        Equipment Recommendations  None recommended by PT    Recommendations for Other Services    Frequency 7X/week   Progress towards PT Goals Progress towards PT goals: Progressing toward goals  Plan Current plan remains appropriate    Precautions / Restrictions Precautions Precautions: Knee Precaution Booklet Issued: No Precaution Comments: Reviewed precautions with patient  Restrictions Weight Bearing Restrictions: Yes RLE Weight Bearing: Weight bearing as tolerated   Pertinent Vitals/Pain no apparent distress    Mobility  Bed Mobility Bed Mobility: Not assessed Supine to Sit: 6: Modified independent (Device/Increase time) Details for Bed Mobility Assistance: Hooked R LE with LLE for OOB Transfers Sit to Stand: 5: Supervision;From chair/3-in-1 Stand to Sit: 5: Supervision;To chair/3-in-1 Details for Transfer Assistance: cues for hand placement Ambulation/Gait Ambulation/Gait Assistance: 4: Min guard Ambulation Distance (Feet): 50 Feet Assistive device: Rolling  walker Ambulation/Gait Assistance Details: Cues for posture. Patient slight buckle of R LEx2 with ambulation Gait Pattern: Step-to pattern Stairs: Yes Stairs Assistance: 4: Min assist Stairs Assistance Details (indicate cue type and reason): Patient cued on technique and sequence. Patient with one large buckle on step needing min A to keep from falling Stair Management Technique: Two rails;Step to pattern;Forwards Number of Stairs: 10    Exercises Total Joint Exercises Quad Sets: AROM;Right;10 reps Heel Slides: AAROM;Right;10 reps Hip ABduction/ADduction: AAROM;Right;10 reps Straight Leg Raises: AAROM;Right;10 reps Long Arc Quad: AAROM;Right;10 reps   PT Diagnosis:    PT Problem List:   PT Treatment Interventions:     PT Goals (current goals can now be found in the care plan section) Acute Rehab PT Goals Patient Stated Goal: To walk with more ease.  Visit Information  Last PT Received On: 09/16/12 Assistance Needed: +1 History of Present Illness: Patient is a 67 yo male s/p Rt TKA.    Subjective Data  Patient Stated Goal: To walk with more ease.   Cognition  Cognition Arousal/Alertness: Awake/alert Behavior During Therapy: WFL for tasks assessed/performed Overall Cognitive Status: Within Functional Limits for tasks assessed    Balance     End of Session PT - End of Session Equipment Utilized During Treatment: Gait belt Activity Tolerance: Patient tolerated treatment well Patient left: in chair;with call bell/phone within reach Nurse Communication: Mobility status CPM Right Knee CPM Right Knee: Off   GP     Fredrich Birks 09/16/2012, 3:17 PM  09/16/2012 Fredrich Birks PTA [phone] pager [phone] office

## 2012-09-16 NOTE — Progress Notes (Signed)
SPORTS MEDICINE AND JOINT REPLACEMENT  Ronald Spurling, MD   Altamese Cabal, PA-C [address] [place], [place], [place]  [address]                             [phone]   PROGRESS NOTE  Subjective:  negative for Chest Pain  negative for Shortness of Breath  negative for Nausea/Vomiting   negative for Calf Pain  negative for Bowel Movement   Tolerating Diet: yes         Patient reports pain as 6 on 0-10 scale.    Objective: Vital signs in last 24 hours:   Patient Vitals for the past 24 hrs:  BP Temp Pulse Resp SpO2  09/16/12 1600 - - - 16 -  09/16/12 1200 - - - 18 -  09/16/12 0800 - - - 18 -  09/16/12 0547 127/68 mmHg 98.8 F (37.1 C) 88 16 96 %  09/16/12 0400 - - - 18 97 %  09/16/12 0209 100/66 mmHg 98.8 F (37.1 C) 96 16 98 %  09/16/12 0000 - - - 17 95 %  09/15/12 2035 127/77 mmHg 98.7 F (37.1 C) 83 16 97 %  09/15/12 2000 - - - 18 96 %    @flow {1959:LAST@   Intake/Output from previous day:   08/18 0701 - 08/19 0700 In: 1500 [I.V.:1500] Out: 1525 [Urine:500; Drains:925]   Intake/Output this shift:   08/19 0701 - 08/19 1900 In: 100  Out: -    Intake/Output     08/18 0701 - 08/19 0700 08/19 0701 - 08/20 0700   I.V. 1500    Other  100   Total Intake 1500 100   Urine 500    Drains 925    Blood 100    Total Output 1525     Net -25 +100           LABORATORY DATA:  Recent Labs  09/15/12 1503 09/16/12 0415  WBC 10.7* 7.4  HGB 13.3 11.2*  HCT 38.0* 32.8*  PLT 174 171    Recent Labs  09/15/12 1503 09/16/12 0415  NA  --  137  K  --  3.6  CL  --  102  CO2  --  28  BUN  --  7  CREATININE 0.80 0.92  GLUCOSE  --  123*  CALCIUM  --  8.4   Lab Results  Component Value Date   INR 0.99 09/05/2012    Examination:  General appearance: alert, cooperative and no distress Extremities: Homans sign is negative, no sign of DVT  Wound Exam: clean, dry, intact   Drainage:  None: wound tissue dry  Motor Exam: EHL and FHL Intact  Sensory Exam:  Deep Peroneal normal   Assessment:    1 Day Post-Op  Procedure(s) (LRB): TOTAL KNEE ARTHROPLASTY- right (Right)  ADDITIONAL DIAGNOSIS:  Active Problems:   * No active hospital problems. *  Acute Blood Loss Anemia   Plan: Physical Therapy as ordered Weight Bearing as Tolerated (WBAT)  DVT Prophylaxis:  Lovenox  DISCHARGE PLAN: Home  DISCHARGE NEEDS: HHPT, CPM, Walker and 3-in-1 comode seat         Ronald Chaney 09/16/2012, 5:16 PM

## 2012-09-16 NOTE — Progress Notes (Signed)
09/16/12 Spoke with patient about HHC, he chose [facility]. Contacted Ferrell Flam at [facility] and set up HHPT. Rolling walker, 3N1, and CPM delivered to home by [employer]. Patient will have assistance at home. Jacquelynn Cree RN, BSN, CCM

## 2012-09-16 NOTE — Anesthesia Postprocedure Evaluation (Signed)
  Anesthesia Post-op Note  Patient: Ronald Chaney  Procedure(s) Performed: Procedure(s): TOTAL KNEE ARTHROPLASTY- right (Right)  Patient Location: PACU  Anesthesia Type:GA combined with regional for post-op pain  Level of Consciousness: awake and alert   Airway and Oxygen Therapy: Patient Spontanous Breathing  Post-op Pain: mild  Post-op Assessment: Post-op Vital signs reviewed, Patient's Cardiovascular Status Stable, Respiratory Function Stable, Patent Airway, No signs of Nausea or vomiting and Pain level controlled  Post-op Vital Signs: stable  Complications: No apparent anesthesia complications

## 2012-09-17 ENCOUNTER — Encounter (HOSPITAL_COMMUNITY): Payer: Self-pay | Admitting: Orthopedic Surgery

## 2012-09-17 LAB — CBC
Hemoglobin: 9.2 g/dL — ABNORMAL LOW (ref 13.0–17.0)
MCH: 29.6 pg (ref 26.0–34.0)
MCV: 86.5 fL (ref 78.0–100.0)
Platelets: 145 10*3/uL — ABNORMAL LOW (ref 150–400)
RBC: 3.11 MIL/uL — ABNORMAL LOW (ref 4.22–5.81)

## 2012-09-17 LAB — BASIC METABOLIC PANEL
CO2: 27 mEq/L (ref 19–32)
Calcium: 8.5 mg/dL (ref 8.4–10.5)
Creatinine, Ser: 0.97 mg/dL (ref 0.50–1.35)
Glucose, Bld: 170 mg/dL — ABNORMAL HIGH (ref 70–99)

## 2012-09-17 NOTE — Progress Notes (Signed)
Physical Therapy Treatment Patient Details Name: Ronald Chaney MRN: [id number] DOB: [birth year] Today's Date: 09/17/2012 Time: 5621-3086 PT Time Calculation (min): 25 min  PT Assessment / Plan / Recommendation  History of Present Illness Patient is a 67 yo male s/p Rt TKA.   PT Comments   Patient able to practice steps again this morning and completed them with more ease and control than yesterday. Patient able to DC today based on goals met by PT  Follow Up Recommendations  Home health PT;Supervision/Assistance - 24 hour     Does the patient have the potential to tolerate intense rehabilitation     Barriers to Discharge        Equipment Recommendations  None recommended by PT    Recommendations for Other Services    Frequency 7X/week   Progress towards PT Goals Progress towards PT goals: Progressing toward goals  Plan Current plan remains appropriate    Precautions / Restrictions Precautions Precautions: Knee Restrictions Weight Bearing Restrictions: Yes RLE Weight Bearing: Weight bearing as tolerated   Pertinent Vitals/Pain no apparent distress     Mobility  Transfers Sit to Stand: 6: Modified independent (Device/Increase time) Stand to Sit: 6: Modified independent (Device/Increase time) Ambulation/Gait Ambulation/Gait Assistance: 5: Supervision Ambulation Distance (Feet): 200 Feet Assistive device: Rolling walker Ambulation/Gait Assistance Details: CUes to start to attempt walking with step through pattern and to increase weight through LEs and take weight off of walker with UEs Gait Pattern: Step-through pattern;Step-to pattern Stairs: Yes Stairs Assistance: 4: Min guard Stairs Assistance Details (indicate cue type and reason): Patient able to recall technique. No buckilng or LOB Stair Management Technique: Two rails;Step to pattern;Forwards Number of Stairs: 10    Exercises     PT Diagnosis:    PT Problem List:   PT Treatment Interventions:     PT Goals  (current goals can now be found in the care plan section)    Visit Information  Last PT Received On: 09/17/12 Assistance Needed: +1 History of Present Illness: Patient is a 67 yo male s/p Rt TKA.    Subjective Data      Cognition  Cognition Arousal/Alertness: Awake/alert Behavior During Therapy: WFL for tasks assessed/performed Overall Cognitive Status: Within Functional Limits for tasks assessed    Balance     End of Session PT - End of Session Equipment Utilized During Treatment: Gait belt Activity Tolerance: Patient tolerated treatment well Patient left: in chair;with call bell/phone within reach Nurse Communication: Mobility status CPM Right Knee CPM Right Knee: Off   GP     Fredrich Birks 09/17/2012, 8:59 AM 09/17/2012 Fredrich Birks PTA [phone] pager [phone] office

## 2013-03-13 ENCOUNTER — Other Ambulatory Visit: Payer: Self-pay | Admitting: Orthopedic Surgery

## 2013-03-16 ENCOUNTER — Other Ambulatory Visit: Payer: Self-pay | Admitting: Orthopedic Surgery

## 2013-03-19 ENCOUNTER — Encounter (HOSPITAL_COMMUNITY): Payer: Self-pay | Admitting: Pharmacy Technician

## 2013-03-19 NOTE — Pre-Procedure Instructions (Addendum)
Ronald Chaney  03/19/2013   Your procedure is scheduled on: Monday, March 30, 2013 at 10:09 AM  Report to [facility] Short Stay (use Main Entrance "A'') at 7:00 AM.  Call this number if you have problems the morning of surgery: [phone]   Remember:   Do not eat food or drink liquids after midnight.   Take these medicines the morning of surgery with A SIP OF WATER: sertraline (ZOLOFT) 50 MG tablet, if needed: traMADol (ULTRAM) 50 MG hydrocodone Stop taking Aspirin, Multivitamins and herbal medications. Do not take any NSAIDs ie: Ibuprofen, Advil, Naproxen or any medication containing Aspirin. Stop 5 days prior to procedure.  Do not wear jewelry, make-up or nail polish.  Do not wear lotions, powders, or perfumes. You may wear deodorant.  Do not shave 48 hours prior to surgery. Men may shave face and neck.  Do not bring valuables to the hospital.  [facility] is not responsible for any belongings or valuables.               Contacts, dentures or bridgework may not be worn into surgery.  Leave suitcase in the car. After surgery it may be brought to your room.  For patients admitted to the hospital, discharge time is determined by your treatment team.               Patients discharged the day of surgery will not be allowed to drive home.  Name and phone number of your driver:  Special Instructions:  Special Instructions:Special Instructions: [facility] - Preparing for Surgery  Before surgery, you can play an important role.  Because skin is not sterile, your skin needs to be as free of germs as possible.  You can reduce the number of germs on you skin by washing with CHG (chlorahexidine gluconate) soap before surgery.  CHG is an antiseptic cleaner which kills germs and bonds with the skin to continue killing germs even after washing.  Please DO NOT use if you have an allergy to CHG or antibacterial soaps.  If your skin becomes reddened/irritated stop using the CHG and inform your nurse when  you arrive at Short Stay.  Do not shave (including legs and underarms) for at least 48 hours prior to the first CHG shower.  You may shave your face.  Please follow these instructions carefully:   1.  Shower with CHG Soap the night before surgery and the morning of Surgery.  2.  If you choose to wash your hair, wash your hair first as usual with your normal shampoo.  3.  After you shampoo, rinse your hair and body thoroughly to remove the Shampoo.  4.  Use CHG as you would any other liquid soap.  You can apply chg directly  to the skin and wash gently with scrungie or a clean washcloth.  5.  Apply the CHG Soap to your body ONLY FROM THE NECK DOWN.  Do not use on open wounds or open sores.  Avoid contact with your eyes, ears, mouth and genitals (private parts).  Wash genitals (private parts) with your normal soap.  6.  Wash thoroughly, paying special attention to the area where your surgery will be performed.  7.  Thoroughly rinse your body with warm water from the neck down.  8.  DO NOT shower/wash with your normal soap after using and rinsing off the CHG Soap.  9.  Pat yourself dry with a clean towel.  10.  Wear clean pajamas.            11.  Place clean sheets on your bed the night of your first shower and do not sleep with pets.  Day of Surgery  Do not apply any lotions/deodorants the morning of surgery.  Please wear clean clothes to the hospital/surgery center.   Please read over the following fact sheets that you were given: Pain Booklet, Coughing and Deep Breathing, Blood Transfusion Information, Total Joint Packet, MRSA Information and Surgical Site Infection Prevention

## 2013-03-20 ENCOUNTER — Encounter (HOSPITAL_COMMUNITY)
Admission: RE | Admit: 2013-03-20 | Discharge: 2013-03-20 | Disposition: A | Discharge status: Home / Self Care | Payer: Commercial Managed Care - PPO | Source: Ambulatory Visit | Attending: Orthopedic Surgery | Admitting: Orthopedic Surgery

## 2013-03-20 ENCOUNTER — Other Ambulatory Visit (HOSPITAL_COMMUNITY): Payer: Self-pay

## 2013-03-20 ENCOUNTER — Encounter (HOSPITAL_COMMUNITY): Payer: Self-pay

## 2013-03-20 DIAGNOSIS — Z01812 Encounter for preprocedural laboratory examination: Secondary | ICD-10-CM | POA: Insufficient documentation

## 2013-03-20 HISTORY — DX: Gastro-esophageal reflux disease without esophagitis: K21.9

## 2013-03-20 HISTORY — DX: Major depressive disorder, single episode, unspecified: F32.9

## 2013-03-20 HISTORY — DX: Depression, unspecified: F32.A

## 2013-03-20 HISTORY — DX: Anxiety disorder, unspecified: F41.9

## 2013-03-20 LAB — CBC WITH DIFFERENTIAL/PLATELET
BASOS PCT: 0 % (ref 0–1)
Basophils Absolute: 0 10*3/uL (ref 0.0–0.1)
EOS ABS: 0.1 10*3/uL (ref 0.0–0.7)
Eosinophils Relative: 2 % (ref 0–5)
HEMATOCRIT: 44 % (ref 39.0–52.0)
Hemoglobin: 15 g/dL (ref 13.0–17.0)
Lymphocytes Relative: 24 % (ref 12–46)
Lymphs Abs: 1.5 10*3/uL (ref 0.7–4.0)
MCH: 29.6 pg (ref 26.0–34.0)
MCHC: 34.1 g/dL (ref 30.0–36.0)
MCV: 86.8 fL (ref 78.0–100.0)
MONO ABS: 0.6 10*3/uL (ref 0.1–1.0)
MONOS PCT: 9 % (ref 3–12)
NEUTROS ABS: 4 10*3/uL (ref 1.7–7.7)
Neutrophils Relative %: 64 % (ref 43–77)
Platelets: 203 10*3/uL (ref 150–400)
RBC: 5.07 MIL/uL (ref 4.22–5.81)
RDW: 14.2 % (ref 11.5–15.5)
WBC: 6.3 10*3/uL (ref 4.0–10.5)

## 2013-03-20 LAB — COMPREHENSIVE METABOLIC PANEL
ALK PHOS: 98 U/L (ref 39–117)
ALT: 16 U/L (ref 0–53)
AST: 18 U/L (ref 0–37)
Albumin: 4.1 g/dL (ref 3.5–5.2)
BILIRUBIN TOTAL: 0.5 mg/dL (ref 0.3–1.2)
BUN: 12 mg/dL (ref 6–23)
CHLORIDE: 101 meq/L (ref 96–112)
CO2: 27 mEq/L (ref 19–32)
Calcium: 9.6 mg/dL (ref 8.4–10.5)
Creatinine, Ser: 0.96 mg/dL (ref 0.50–1.35)
GFR, EST NON AFRICAN AMERICAN: 85 mL/min — AB (ref >90)
GLUCOSE: 88 mg/dL (ref 70–99)
Potassium: 4.9 mEq/L (ref 3.7–5.3)
Sodium: 140 mEq/L (ref 137–147)
Total Protein: 7.5 g/dL (ref 6.0–8.3)

## 2013-03-20 LAB — TYPE AND SCREEN
ABO/RH(D): A POS
ANTIBODY SCREEN: NEGATIVE

## 2013-03-20 LAB — URINALYSIS, ROUTINE W REFLEX MICROSCOPIC
BILIRUBIN URINE: NEGATIVE
Glucose, UA: NEGATIVE mg/dL
HGB URINE DIPSTICK: NEGATIVE
KETONES UR: NEGATIVE mg/dL
Leukocytes, UA: NEGATIVE
NITRITE: NEGATIVE
PROTEIN: NEGATIVE mg/dL
Specific Gravity, Urine: 1.008 (ref 1.005–1.030)
UROBILINOGEN UA: 0.2 mg/dL (ref 0.0–1.0)
pH: 6 (ref 5.0–8.0)

## 2013-03-20 LAB — PROTIME-INR
INR: 1.04 (ref 0.00–1.49)
PROTHROMBIN TIME: 13.4 s (ref 11.6–15.2)

## 2013-03-20 LAB — APTT: aPTT: 32 seconds (ref 24–37)

## 2013-03-20 LAB — SURGICAL PCR SCREEN
MRSA, PCR: NEGATIVE
Staphylococcus aureus: POSITIVE — AB

## 2013-03-21 LAB — URINE CULTURE
Colony Count: NO GROWTH
Culture: NO GROWTH

## 2013-03-29 MED ORDER — CEFAZOLIN SODIUM-DEXTROSE 2-3 GM-% IV SOLR
2.0000 g | INTRAVENOUS | Status: AC
  Administered 2013-03-30: 2 g via INTRAVENOUS
  Filled 2013-03-29: qty 50

## 2013-03-29 MED ORDER — TRANEXAMIC ACID 100 MG/ML IV SOLN
1000.0000 mg | INTRAVENOUS | Status: AC
  Administered 2013-03-30: 1000 mg via INTRAVENOUS
  Filled 2013-03-29: qty 10

## 2013-03-29 MED ORDER — BUPIVACAINE LIPOSOME 1.3 % IJ SUSP
20.0000 mL | Freq: Once | INTRAMUSCULAR | Status: DC
  Filled 2013-03-29: qty 20

## 2013-03-30 ENCOUNTER — Encounter (HOSPITAL_COMMUNITY): Payer: Commercial Managed Care - PPO | Admitting: Certified Registered"

## 2013-03-30 ENCOUNTER — Inpatient Hospital Stay (HOSPITAL_COMMUNITY): Payer: Commercial Managed Care - PPO | Admitting: Certified Registered"

## 2013-03-30 ENCOUNTER — Encounter (HOSPITAL_COMMUNITY): Payer: Self-pay | Admitting: Certified Registered"

## 2013-03-30 ENCOUNTER — Inpatient Hospital Stay (HOSPITAL_COMMUNITY)
Admission: RE | Admit: 2013-03-30 | Discharge: 2013-03-31 | DRG: 470 | Disposition: A | Discharge status: Home Health | Payer: Commercial Managed Care - PPO | Source: Ambulatory Visit | Attending: Orthopedic Surgery | Admitting: Orthopedic Surgery

## 2013-03-30 ENCOUNTER — Encounter (HOSPITAL_COMMUNITY)
Admission: RE | Disposition: A | Discharge status: Home Health | Payer: Self-pay | Source: Ambulatory Visit | Attending: Orthopedic Surgery

## 2013-03-30 DIAGNOSIS — F3289 Other specified depressive episodes: Secondary | ICD-10-CM | POA: Diagnosis present

## 2013-03-30 DIAGNOSIS — G8918 Other acute postprocedural pain: Secondary | ICD-10-CM | POA: Diagnosis not present

## 2013-03-30 DIAGNOSIS — K219 Gastro-esophageal reflux disease without esophagitis: Secondary | ICD-10-CM | POA: Diagnosis present

## 2013-03-30 DIAGNOSIS — F329 Major depressive disorder, single episode, unspecified: Secondary | ICD-10-CM | POA: Diagnosis present

## 2013-03-30 DIAGNOSIS — F411 Generalized anxiety disorder: Secondary | ICD-10-CM | POA: Diagnosis present

## 2013-03-30 DIAGNOSIS — D62 Acute posthemorrhagic anemia: Secondary | ICD-10-CM | POA: Diagnosis not present

## 2013-03-30 DIAGNOSIS — Z96659 Presence of unspecified artificial knee joint: Secondary | ICD-10-CM

## 2013-03-30 DIAGNOSIS — M1710 Unilateral primary osteoarthritis, unspecified knee: Principal | ICD-10-CM | POA: Diagnosis present

## 2013-03-30 HISTORY — PX: TOTAL KNEE ARTHROPLASTY: SHX125

## 2013-03-30 LAB — CBC
HCT: 38.3 % — ABNORMAL LOW (ref 39.0–52.0)
Hemoglobin: 13 g/dL (ref 13.0–17.0)
MCH: 29.5 pg (ref 26.0–34.0)
MCHC: 33.9 g/dL (ref 30.0–36.0)
MCV: 86.8 fL (ref 78.0–100.0)
PLATELETS: 201 10*3/uL (ref 150–400)
RBC: 4.41 MIL/uL (ref 4.22–5.81)
RDW: 13.9 % (ref 11.5–15.5)
WBC: 12.2 10*3/uL — ABNORMAL HIGH (ref 4.0–10.5)

## 2013-03-30 LAB — CREATININE, SERUM
CREATININE: 0.87 mg/dL (ref 0.50–1.35)
GFR calc Af Amer: >90 mL/min (ref >90)
GFR calc non Af Amer: 89 mL/min — ABNORMAL LOW (ref >90)

## 2013-03-30 SURGERY — ARTHROPLASTY, KNEE, TOTAL
Anesthesia: Regional | Site: Knee | Laterality: Left

## 2013-03-30 MED ORDER — FENTANYL CITRATE 0.05 MG/ML IJ SOLN
25.0000 ug | INTRAMUSCULAR | Status: DC | PRN
Start: 2013-03-30 — End: 2013-03-30
  Administered 2013-03-30 (×3): 50 ug via INTRAVENOUS

## 2013-03-30 MED ORDER — CELECOXIB 200 MG PO CAPS
200.0000 mg | ORAL_CAPSULE | Freq: Two times a day (BID) | ORAL | Status: DC
  Administered 2013-03-30 – 2013-03-31 (×3): 200 mg via ORAL
  Filled 2013-03-30 (×4): qty 1

## 2013-03-30 MED ORDER — OXYCODONE HCL ER 10 MG PO T12A
10.0000 mg | EXTENDED_RELEASE_TABLET | Freq: Two times a day (BID) | ORAL | Status: DC
  Administered 2013-03-30 – 2013-03-31 (×3): 10 mg via ORAL
  Filled 2013-03-30 (×3): qty 1

## 2013-03-30 MED ORDER — DOCUSATE SODIUM 100 MG PO CAPS
100.0000 mg | ORAL_CAPSULE | Freq: Two times a day (BID) | ORAL | Status: DC
  Administered 2013-03-30 – 2013-03-31 (×3): 100 mg via ORAL
  Filled 2013-03-30 (×3): qty 1

## 2013-03-30 MED ORDER — LIDOCAINE HCL (CARDIAC) 20 MG/ML IV SOLN
INTRAVENOUS | Status: DC | PRN
  Administered 2013-03-30: 80 mg via INTRAVENOUS

## 2013-03-30 MED ORDER — 0.9 % SODIUM CHLORIDE (POUR BTL) OPTIME
TOPICAL | Status: DC | PRN
  Administered 2013-03-30: 1000 mL

## 2013-03-30 MED ORDER — KETOROLAC TROMETHAMINE 30 MG/ML IJ SOLN
15.0000 mg | Freq: Once | INTRAMUSCULAR | Status: AC | PRN
  Administered 2013-03-30: 15 mg via INTRAVENOUS

## 2013-03-30 MED ORDER — PROPOFOL 10 MG/ML IV BOLUS
INTRAVENOUS | Status: AC
  Filled 2013-03-30: qty 20

## 2013-03-30 MED ORDER — METOCLOPRAMIDE HCL 5 MG/ML IJ SOLN: 5.0000 mg | Freq: Three times a day (TID) | INTRAMUSCULAR | Status: DC | PRN

## 2013-03-30 MED ORDER — SENNOSIDES-DOCUSATE SODIUM 8.6-50 MG PO TABS: 1.0000 | ORAL_TABLET | Freq: Every evening | ORAL | Status: DC | PRN

## 2013-03-30 MED ORDER — FENTANYL CITRATE 0.05 MG/ML IJ SOLN
INTRAMUSCULAR | Status: AC
  Filled 2013-03-30: qty 5

## 2013-03-30 MED ORDER — FLEET ENEMA 7-19 GM/118ML RE ENEM: 1.0000 | ENEMA | Freq: Once | RECTAL | Status: AC | PRN

## 2013-03-30 MED ORDER — LIDOCAINE HCL (CARDIAC) 20 MG/ML IV SOLN
INTRAVENOUS | Status: AC
  Filled 2013-03-30: qty 5

## 2013-03-30 MED ORDER — ACETAMINOPHEN 325 MG PO TABS: 325.0000 mg | ORAL_TABLET | ORAL | Status: DC | PRN

## 2013-03-30 MED ORDER — SODIUM CHLORIDE 0.9 % IV SOLN
INTRAVENOUS | Status: DC
  Administered 2013-03-30 – 2013-03-31 (×2): via INTRAVENOUS

## 2013-03-30 MED ORDER — MENTHOL 3 MG MT LOZG: 1.0000 | LOZENGE | OROMUCOSAL | Status: DC | PRN

## 2013-03-30 MED ORDER — PHENYLEPHRINE 40 MCG/ML (10ML) SYRINGE FOR IV PUSH (FOR BLOOD PRESSURE SUPPORT)
PREFILLED_SYRINGE | INTRAVENOUS | Status: AC
  Filled 2013-03-30: qty 10

## 2013-03-30 MED ORDER — OXYCODONE HCL 5 MG PO TABS
5.0000 mg | ORAL_TABLET | ORAL | Status: DC | PRN
  Administered 2013-03-30 – 2013-03-31 (×7): 10 mg via ORAL
  Filled 2013-03-30 (×7): qty 2

## 2013-03-30 MED ORDER — DIPHENHYDRAMINE HCL 12.5 MG/5ML PO ELIX: 12.5000 mg | ORAL_SOLUTION | ORAL | Status: DC | PRN

## 2013-03-30 MED ORDER — ENOXAPARIN SODIUM 30 MG/0.3ML ~~LOC~~ SOLN
30.0000 mg | Freq: Two times a day (BID) | SUBCUTANEOUS | Status: DC
  Administered 2013-03-31: 30 mg via SUBCUTANEOUS
  Filled 2013-03-30 (×3): qty 0.3

## 2013-03-30 MED ORDER — PHENOL 1.4 % MT LIQD: 1.0000 | OROMUCOSAL | Status: DC | PRN

## 2013-03-30 MED ORDER — ROPIVACAINE HCL 5 MG/ML IJ SOLN
INTRAMUSCULAR | Status: DC | PRN
  Administered 2013-03-30: 15 mL via PERINEURAL

## 2013-03-30 MED ORDER — FENTANYL CITRATE 0.05 MG/ML IJ SOLN
INTRAMUSCULAR | Status: DC | PRN
  Administered 2013-03-30: 100 ug via INTRAVENOUS
  Administered 2013-03-30 (×2): 50 ug via INTRAVENOUS

## 2013-03-30 MED ORDER — ONDANSETRON HCL 4 MG/2ML IJ SOLN: 4.0000 mg | Freq: Once | INTRAMUSCULAR | Status: DC | PRN

## 2013-03-30 MED ORDER — CEFAZOLIN SODIUM-DEXTROSE 2-3 GM-% IV SOLR
2.0000 g | Freq: Four times a day (QID) | INTRAVENOUS | Status: AC
  Administered 2013-03-30 (×2): 2 g via INTRAVENOUS
  Filled 2013-03-30 (×2): qty 50

## 2013-03-30 MED ORDER — EPHEDRINE SULFATE 50 MG/ML IJ SOLN
INTRAMUSCULAR | Status: DC | PRN
  Administered 2013-03-30: 10 mg via INTRAVENOUS
  Administered 2013-03-30: 400 mg via INTRAVENOUS

## 2013-03-30 MED ORDER — SERTRALINE HCL 50 MG PO TABS
50.0000 mg | ORAL_TABLET | Freq: Every morning | ORAL | Status: DC
  Administered 2013-03-31: 50 mg via ORAL
  Filled 2013-03-30: qty 1

## 2013-03-30 MED ORDER — CHLORHEXIDINE GLUCONATE 4 % EX LIQD
60.0000 mL | Freq: Once | CUTANEOUS | Status: DC
  Filled 2013-03-30: qty 60

## 2013-03-30 MED ORDER — METHOCARBAMOL 500 MG PO TABS
500.0000 mg | ORAL_TABLET | Freq: Four times a day (QID) | ORAL | Status: DC | PRN
  Administered 2013-03-30 – 2013-03-31 (×3): 500 mg via ORAL
  Filled 2013-03-30 (×4): qty 1

## 2013-03-30 MED ORDER — ONDANSETRON HCL 4 MG PO TABS: 4.0000 mg | ORAL_TABLET | Freq: Four times a day (QID) | ORAL | Status: DC | PRN

## 2013-03-30 MED ORDER — SODIUM CHLORIDE 0.9 % IR SOLN
Status: DC | PRN
  Administered 2013-03-30: 3000 mL

## 2013-03-30 MED ORDER — PROPOFOL 10 MG/ML IV BOLUS
INTRAVENOUS | Status: DC | PRN
  Administered 2013-03-30: 50 mg via INTRAVENOUS
  Administered 2013-03-30: 200 mg via INTRAVENOUS

## 2013-03-30 MED ORDER — ONDANSETRON HCL 4 MG/2ML IJ SOLN
INTRAMUSCULAR | Status: AC
  Filled 2013-03-30: qty 2

## 2013-03-30 MED ORDER — EPHEDRINE SULFATE 50 MG/ML IJ SOLN
INTRAMUSCULAR | Status: AC
  Filled 2013-03-30: qty 1

## 2013-03-30 MED ORDER — METHOCARBAMOL 100 MG/ML IJ SOLN
500.0000 mg | Freq: Four times a day (QID) | INTRAVENOUS | Status: DC | PRN
  Administered 2013-03-30: 500 mg via INTRAVENOUS
  Filled 2013-03-30: qty 5

## 2013-03-30 MED ORDER — ONDANSETRON HCL 4 MG/2ML IJ SOLN: 4.0000 mg | Freq: Four times a day (QID) | INTRAMUSCULAR | Status: DC | PRN

## 2013-03-30 MED ORDER — ACETAMINOPHEN 325 MG PO TABS
650.0000 mg | ORAL_TABLET | Freq: Four times a day (QID) | ORAL | Status: DC | PRN
  Administered 2013-03-31 (×2): 650 mg via ORAL
  Filled 2013-03-30 (×2): qty 2

## 2013-03-30 MED ORDER — BUPIVACAINE LIPOSOME 1.3 % IJ SUSP
INTRAMUSCULAR | Status: DC | PRN
  Administered 2013-03-30: 20 mL

## 2013-03-30 MED ORDER — MIDAZOLAM HCL 5 MG/5ML IJ SOLN
INTRAMUSCULAR | Status: DC | PRN
Start: 2013-03-30 — End: 2013-03-30
  Administered 2013-03-30: 2 mg via INTRAVENOUS

## 2013-03-30 MED ORDER — FENTANYL CITRATE 0.05 MG/ML IJ SOLN
INTRAMUSCULAR | Status: AC
  Filled 2013-03-30: qty 2

## 2013-03-30 MED ORDER — CHLORHEXIDINE GLUCONATE 4 % EX LIQD
60.0000 mL | Freq: Once | CUTANEOUS | Status: DC
Start: 2013-03-30 — End: 2013-03-30
  Filled 2013-03-30: qty 60

## 2013-03-30 MED ORDER — ACETAMINOPHEN 650 MG RE SUPP: 650.0000 mg | Freq: Four times a day (QID) | RECTAL | Status: DC | PRN

## 2013-03-30 MED ORDER — BUPIVACAINE-EPINEPHRINE 0.5% -1:200000 IJ SOLN
INTRAMUSCULAR | Status: DC | PRN
  Administered 2013-03-30: 30 mL

## 2013-03-30 MED ORDER — ALUM & MAG HYDROXIDE-SIMETH 200-200-20 MG/5ML PO SUSP: 30.0000 mL | ORAL | Status: DC | PRN

## 2013-03-30 MED ORDER — PHENYLEPHRINE HCL 10 MG/ML IJ SOLN
INTRAMUSCULAR | Status: DC | PRN
  Administered 2013-03-30 (×2): 80 ug via INTRAVENOUS

## 2013-03-30 MED ORDER — HYDROMORPHONE HCL PF 1 MG/ML IJ SOLN
1.0000 mg | INTRAMUSCULAR | Status: DC | PRN
  Administered 2013-03-30 – 2013-03-31 (×7): 1 mg via INTRAVENOUS
  Filled 2013-03-30 (×7): qty 1

## 2013-03-30 MED ORDER — BISACODYL 5 MG PO TBEC: 5.0000 mg | DELAYED_RELEASE_TABLET | Freq: Every day | ORAL | Status: DC | PRN

## 2013-03-30 MED ORDER — LACTATED RINGERS IV SOLN
INTRAVENOUS | Status: DC
  Administered 2013-03-30 (×2): via INTRAVENOUS

## 2013-03-30 MED ORDER — KETOROLAC TROMETHAMINE 30 MG/ML IJ SOLN
INTRAMUSCULAR | Status: AC
  Filled 2013-03-30: qty 1

## 2013-03-30 MED ORDER — METOCLOPRAMIDE HCL 5 MG PO TABS
5.0000 mg | ORAL_TABLET | Freq: Three times a day (TID) | ORAL | Status: DC | PRN
  Filled 2013-03-30: qty 2

## 2013-03-30 MED ORDER — ONDANSETRON HCL 4 MG/2ML IJ SOLN
INTRAMUSCULAR | Status: DC | PRN
  Administered 2013-03-30: 4 mg via INTRAVENOUS

## 2013-03-30 MED ORDER — ACETAMINOPHEN 160 MG/5ML PO SOLN
325.0000 mg | ORAL | Status: DC | PRN
  Filled 2013-03-30: qty 20.3

## 2013-03-30 MED ORDER — MIDAZOLAM HCL 2 MG/2ML IJ SOLN
INTRAMUSCULAR | Status: AC
  Filled 2013-03-30: qty 2

## 2013-03-30 SURGICAL SUPPLY — 56 items
BANDAGE ELASTIC 6 VELCRO ST LF (GAUZE/BANDAGES/DRESSINGS) ×3 IMPLANT
BANDAGE ESMARK 6X9 LF (GAUZE/BANDAGES/DRESSINGS) ×1 IMPLANT
BINDER ABD UNIV 10 28-50 (GAUZE/BANDAGES/DRESSINGS) ×1 IMPLANT
BINDER ABDOM UNIV 10 (GAUZE/BANDAGES/DRESSINGS) ×3
BLADE SAGITTAL 13X1.27X60 (BLADE) ×2 IMPLANT
BLADE SAGITTAL 13X1.27X60MM (BLADE) ×1
BLADE SAW SGTL 83.5X18.5 (BLADE) ×3 IMPLANT
BNDG ESMARK 6X9 LF (GAUZE/BANDAGES/DRESSINGS) ×3
BOWL SMART MIX CTS (DISPOSABLE) ×3 IMPLANT
CAP KNEE CMT PERSONA ×3 IMPLANT
CEMENT BONE SIMPLEX SPEEDSET (Cement) ×6 IMPLANT
COVER SURGICAL LIGHT HANDLE (MISCELLANEOUS) ×3 IMPLANT
CUFF TOURNIQUET SINGLE 34IN LL (TOURNIQUET CUFF) ×3 IMPLANT
DRAPE EXTREMITY T 121X128X90 (DRAPE) ×3 IMPLANT
DRAPE INCISE IOBAN 66X45 STRL (DRAPES) ×6 IMPLANT
DRAPE PROXIMA HALF (DRAPES) ×3 IMPLANT
DRAPE U-SHAPE 47X51 STRL (DRAPES) ×3 IMPLANT
DRSG ADAPTIC 3X8 NADH LF (GAUZE/BANDAGES/DRESSINGS) ×3 IMPLANT
DRSG PAD ABDOMINAL 8X10 ST (GAUZE/BANDAGES/DRESSINGS) ×3 IMPLANT
DURAPREP 26ML APPLICATOR (WOUND CARE) ×3 IMPLANT
ELECT REM PT RETURN 9FT ADLT (ELECTROSURGICAL) ×3
ELECTRODE REM PT RTRN 9FT ADLT (ELECTROSURGICAL) ×1 IMPLANT
EVACUATOR 1/8 PVC DRAIN (DRAIN) ×3 IMPLANT
GLOVE BIOGEL M 7.0 STRL (GLOVE) IMPLANT
GLOVE BIOGEL PI IND STRL 7.5 (GLOVE) IMPLANT
GLOVE BIOGEL PI IND STRL 8.5 (GLOVE) ×2 IMPLANT
GLOVE BIOGEL PI INDICATOR 7.5 (GLOVE)
GLOVE BIOGEL PI INDICATOR 8.5 (GLOVE) ×4
GLOVE SURG ORTHO 8.0 STRL STRW (GLOVE) ×6 IMPLANT
GOWN PREVENTION PLUS XLARGE (GOWN DISPOSABLE) ×6 IMPLANT
GOWN STRL NON-REIN LRG LVL3 (GOWN DISPOSABLE) ×6 IMPLANT
HANDPIECE INTERPULSE COAX TIP (DISPOSABLE) ×2
HOOD PEEL AWAY FACE SHEILD DIS (HOOD) ×9 IMPLANT
KIT BASIN OR (CUSTOM PROCEDURE TRAY) ×3 IMPLANT
KIT ROOM TURNOVER OR (KITS) ×3 IMPLANT
MANIFOLD NEPTUNE II (INSTRUMENTS) ×3 IMPLANT
NEEDLE 22X1 1/2 (OR ONLY) (NEEDLE) ×6 IMPLANT
NS IRRIG 1000ML POUR BTL (IV SOLUTION) ×3 IMPLANT
PACK TOTAL JOINT (CUSTOM PROCEDURE TRAY) ×3 IMPLANT
PAD ARMBOARD 7.5X6 YLW CONV (MISCELLANEOUS) ×6 IMPLANT
PADDING CAST COTTON 6X4 STRL (CAST SUPPLIES) ×3 IMPLANT
SET HNDPC FAN SPRY TIP SCT (DISPOSABLE) ×1 IMPLANT
SPONGE GAUZE 4X4 12PLY (GAUZE/BANDAGES/DRESSINGS) ×3 IMPLANT
STAPLER VISISTAT 35W (STAPLE) ×3 IMPLANT
SUCTION FRAZIER TIP 10 FR DISP (SUCTIONS) ×3 IMPLANT
SUT BONE WAX W31G (SUTURE) ×3 IMPLANT
SUT VIC AB 0 CTB1 27 (SUTURE) ×6 IMPLANT
SUT VIC AB 1 CT1 27 (SUTURE) ×6
SUT VIC AB 1 CT1 27XBRD ANBCTR (SUTURE) ×3 IMPLANT
SUT VIC AB 2-0 CT1 27 (SUTURE) ×4
SUT VIC AB 2-0 CT1 TAPERPNT 27 (SUTURE) ×2 IMPLANT
SYR CONTROL 10ML LL (SYRINGE) ×6 IMPLANT
TOWEL OR 17X24 6PK STRL BLUE (TOWEL DISPOSABLE) ×3 IMPLANT
TOWEL OR 17X26 10 PK STRL BLUE (TOWEL DISPOSABLE) ×3 IMPLANT
TRAY FOLEY CATH 14FR (SET/KITS/TRAYS/PACK) IMPLANT
WATER STERILE IRR 1000ML POUR (IV SOLUTION) IMPLANT

## 2013-03-30 NOTE — Anesthesia Procedure Notes (Signed)
Anesthesia Regional Block:  Adductor canal block  Pre-Anesthetic Checklist: ,, timeout performed, Correct Patient, Correct Site, Correct Laterality, Correct Procedure, Correct Position, site marked, Risks and benefits discussed,  Surgical consent,  Pre-op evaluation,  At surgeon's request and post-op pain management  Laterality: Lower and Left  Prep: chloraprep       Needles:  Injection technique: Single-shot  Needle Type: Echogenic Needle          Additional Needles:  Procedures: ultrasound guided (picture in chart) Adductor canal block Narrative:  Start time: 03/30/2013 9:42 AM End time: 03/30/2013 9:48 AM Injection made incrementally with aspirations every 5 mL.  Performed by: Personally  Anesthesiologist: Reef Achterberg  Additional Notes: H+P and labs reviewed, risks and benefits discussed with patient, procedure tolerated well without complications

## 2013-03-30 NOTE — H&P (Signed)
  Ronald Chaney MRN:  [id number] DOB/SEX:  Feb 26, 1945/male  CHIEF COMPLAINT:  Painful left Knee  HISTORY: Patient is a 68 y.o. male presented with a history of pain in the left knee. Onset of symptoms was gradual starting several years ago with gradually worsening course since that time. Prior procedures on the knee include none. Patient has been treated conservatively with over-the-counter NSAIDs and activity modification. Patient currently rates pain in the knee at 10 out of 10 with activity. There is pain at night.  PAST MEDICAL HISTORY: There are no active problems to display for this patient.  Past Medical History  Diagnosis Date  . Arthritis   . Anxiety   . Depression   . GERD (gastroesophageal reflux disease)     occ   Past Surgical History  Procedure Laterality Date  . Vein ligation and stripping Bilateral 2012  . Eye surgery Right 2012    Laser- glucoma  . Turbinate reduction  2012  . Knee arthroscopy Bilateral 1984  . Knee surgery Right 1967    cartlige  . Total knee arthroplasty Right 09/15/2012    Procedure: TOTAL KNEE ARTHROPLASTY- right;  Surgeon: Dannielle HuhSteve Lucey, MD;  Location: [facility] OR;  Service: Orthopedics;  Laterality: Right;  . Tonsillectomy       MEDICATIONS:   No prescriptions prior to admission    ALLERGIES:  No Known Allergies  REVIEW OF SYSTEMS:  Pertinent items are noted in HPI.   FAMILY HISTORY:  No family history on file.  SOCIAL HISTORY:   History  Substance Use Topics  . Smoking status: Never Smoker   . Smokeless tobacco: Never Used  . Alcohol Use: 2.4 oz/week    4 Cans of beer per week     EXAMINATION:  Vital signs in last 24 hours:    General appearance: alert, cooperative and no distress Lungs: clear to auscultation bilaterally Heart: regular rate and rhythm, S1, S2 normal, no murmur, click, rub or gallop Abdomen: soft, non-tender; bowel sounds normal; no masses,  no organomegaly Extremities: extremities normal, atraumatic, no  cyanosis or edema and Homans sign is negative, no sign of DVT Pulses: 2+ and symmetric Skin: Skin color, texture, turgor normal. No rashes or lesions Neurologic: Alert and oriented X 3, normal strength and tone. Normal symmetric reflexes. Normal coordination and gait  Musculoskeletal:  ROM 0-110, Ligaments intact,  Imaging Review Plain radiographs demonstrate severe degenerative joint disease of the left knee. The overall alignment is mild valgus. The bone quality appears to be good for age and reported activity level.  Assessment/Plan: End stage arthritis, left knee   The patient history, physical examination and imaging studies are consistent with advanced degenerative joint disease of the left knee. The patient has failed conservative treatment.  The clearance notes were reviewed.  After discussion with the patient it was felt that Total Knee Replacement was indicated. The procedure,  risks, and benefits of total knee arthroplasty were presented and reviewed. The risks including but not limited to aseptic loosening, infection, blood clots, vascular injury, stiffness, patella tracking problems complications among others were discussed. The patient acknowledged the explanation, agreed to proceed with the plan.  Ronald Chaney 03/30/2013, 6:34 AM

## 2013-03-30 NOTE — Evaluation (Signed)
Physical Therapy Evaluation Patient Details Name: Ronald Chaney MRN: [id number] DOB: [birth year] Today's Date: 03/30/2013 Time: 4098-1191 PT Time Calculation (min): 33 min  PT Assessment / Plan / Recommendation History of Present Illness  Patient is a 68 yo male s/p Lt TKA.  Patient had RT TKA in August 2014.  Clinical Impression  Patient presents with problems listed below.  Will benefit from acute PT to maximize independence prior to discharge home with wife.  Recommend HHPT at discharge for continued therapy. Noted drain full and knee bleeding through bandage onto bed.  RN to room and changed bandaging.  Limited session to standing only.  Per RN, returned to bed for monitoring of knee.  Applied ice to knee.    PT Assessment  Patient needs continued PT services    Follow Up Recommendations  Home health PT;Supervision/Assistance - 24 hour    Does the patient have the potential to tolerate intense rehabilitation      Barriers to Discharge        Equipment Recommendations  None recommended by PT    Recommendations for Other Services     Frequency 7X/week    Precautions / Restrictions Precautions Precautions: Knee Precaution Booklet Issued: Yes (comment) Precaution Comments: Reviewed precautions with patient and wife. Restrictions Weight Bearing Restrictions: Yes LLE Weight Bearing: Weight bearing as tolerated   Pertinent Vitals/Pain Pain 8/10 with activity.      Mobility  Bed Mobility Overal bed mobility: Needs Assistance Bed Mobility: Supine to Sit;Sit to Supine Supine to sit: Min assist Sit to supine: Min assist General bed mobility comments: Verbal cues for technique.  Assist to move LLE off of and onto bed. Transfers Overall transfer level: Needs assistance Equipment used: Rolling walker (2 wheeled) Transfers: Sit to/from Stand Sit to Stand: Min assist General transfer comment: Verbal cues for hand placement and technique.  Patient impulsive, moving very  quickly.  Instructed patient to slow down for safety.  Patient stood x 2 minutes to void.  Returned to sitting with verbal cues for technique.  (Knee bleeding through bandaging, so returned to supine with ice on knee.)    Exercises Total Joint Exercises Ankle Circles/Pumps: AROM;Both;10 reps;Supine   PT Diagnosis: Difficulty walking;Acute pain  PT Problem List: Decreased strength;Decreased range of motion;Decreased balance;Decreased activity tolerance;Decreased mobility;Decreased knowledge of use of DME;Decreased knowledge of precautions;Decreased safety awareness;Pain PT Treatment Interventions: DME instruction;Gait training;Stair training;Functional mobility training;Therapeutic exercise;Patient/family education     PT Goals(Current goals can be found in the care plan section) Acute Rehab PT Goals Patient Stated Goal: To go home tomorrow PT Goal Formulation: With patient/family Time For Goal Achievement: 04/06/13 Potential to Achieve Goals: Good  Visit Information  Last PT Received On: 03/30/13 Assistance Needed: +1 History of Present Illness: Patient is a 67 yo male s/p Lt TKA.  Patient had RT TKA in August 2014.       Prior Functioning  Home Living Family/patient expects to be discharged to:: Private residence Living Arrangements: Spouse/significant other Available Help at Discharge: Family;Available 24 hours/day Type of Home: House Home Access: Stairs to enter [employer] of Steps: 12 (2 sets of 6 stairs with landing between) Entrance Stairs-Rails: Right Home Layout: Two level;Bed/bath upstairs (Reports he will be going to 2nd floor and staying there.) Home Equipment: Walker - 2 wheels;Bedside commode Prior Function Level of Independence: Independent Communication Communication: No difficulties    Cognition  Cognition Arousal/Alertness: Awake/alert Behavior During Therapy: WFL for tasks assessed/performed Overall Cognitive Status: Within Functional Limits  for tasks  assessed    Extremity/Trunk Assessment Upper Extremity Assessment Upper Extremity Assessment: Overall WFL for tasks assessed Lower Extremity Assessment Lower Extremity Assessment: LLE deficits/detail LLE Deficits / Details: Decreased strength and ROM due to pain/surgery.   LLE: Unable to fully assess due to pain Cervical / Trunk Assessment Cervical / Trunk Assessment: Normal   Balance Balance Overall balance assessment: Needs assistance Standing balance support: Bilateral upper extremity supported Standing balance-Leahy Scale: Fair Standing balance comment: Assist for balance in standing - slightly unsteady.  End of Session PT - End of Session Equipment Utilized During Treatment: Gait belt Activity Tolerance: Patient limited by pain Patient left: in bed;with call bell/phone within reach;with family/visitor present Nurse Communication: Mobility status (Drain full and knee bleeding through bandage.  RN to room.) CPM Left Knee CPM Left Knee: Off (Off at 18:00)  GP     Vena Austriaavis, Illona Bulman H 03/30/2013, 7:13 PM Durenda HurtSusan H. Renaldo Fiddleravis, PT, [facility] Acute Rehab Services Pager [phone]

## 2013-03-30 NOTE — Preoperative (Signed)
Beta Blockers   Reason not to administer Beta Blockers:Not Applicable 

## 2013-03-30 NOTE — Plan of Care (Signed)
Problem: Consults Goal: Diagnosis- Total Joint Replacement Primary Total Knee Left     

## 2013-03-30 NOTE — Progress Notes (Signed)
Orthopedic Tech Progress Note Patient Details:  Wilhemena DurieBen Chmiel 29-Dec-1945 [id number]  CPM Left Knee CPM Left Knee: On Left Knee Flexion (Degrees): 90 Left Knee Extension (Degrees): 0 Additional Comments: Trapeze bar and foot roll   Shawnie PonsCammer, Valon Glasscock Carol 03/30/2013, 12:49 PM

## 2013-03-30 NOTE — Progress Notes (Signed)
Utilization Review Completed.Ronald Chaney T3/03/2013  

## 2013-03-30 NOTE — Transfer of Care (Signed)
Immediate Anesthesia Transfer of Care Note  Patient: Ronald Chaney  Procedure(s) Performed: Procedure(s): TOTAL KNEE ARTHROPLASTY (Left)  Patient Location: PACU  Anesthesia Type:GA combined with regional for post-op pain  Level of Consciousness: awake and oriented  Airway & Oxygen Therapy: Patient Spontanous Breathing and Patient connected to nasal cannula oxygen  Post-op Assessment: Report given to PACU RN, Post -op Vital signs reviewed and stable and Patient moving all extremities  Post vital signs: Reviewed and stable  Complications: No apparent anesthesia complications

## 2013-03-30 NOTE — Anesthesia Preprocedure Evaluation (Signed)
Anesthesia Evaluation  Patient identified by MRN, date of birth, ID band Patient awake    Reviewed: Allergy & Precautions, H&P , NPO status , Patient's Chart, lab work & pertinent test results  History of Anesthesia Complications Negative for: history of anesthetic complications  Airway Mallampati: II TM Distance: >3 FB Neck ROM: Full    Dental  (+) Teeth Intact,    Pulmonary neg pulmonary ROS,  breath sounds clear to auscultation        Cardiovascular - angina- Past MI and - CHF negative cardio ROS  Rhythm:Regular     Neuro/Psych Anxiety Depression negative neurological ROS     GI/Hepatic Neg liver ROS, GERD-  Controlled,  Endo/Other  negative endocrine ROS  Renal/GU negative Renal ROS     Musculoskeletal  (+) Arthritis -, Osteoarthritis,    Abdominal   Peds  Hematology negative hematology ROS (+)   Anesthesia Other Findings   Reproductive/Obstetrics                           Anesthesia Physical Anesthesia Plan  ASA: II  Anesthesia Plan: General and Regional   Post-op Pain Management:    Induction:   Airway Management Planned: LMA  Additional Equipment: None  Intra-op Plan:   Post-operative Plan: Extubation in OR  Informed Consent: I have reviewed the patients History and Physical, chart, labs and discussed the procedure including the risks, benefits and alternatives for the proposed anesthesia with the patient or authorized representative who has indicated his/her understanding and acceptance.   Dental advisory given  Plan Discussed with: CRNA and Surgeon  Anesthesia Plan Comments:         Anesthesia Quick Evaluation

## 2013-03-30 NOTE — Progress Notes (Signed)
Orthopedic Tech Progress Note Patient Details:  Ronald Chaney Oct 26, 1945 [id number] On cpm at 8:00 pm LLE Patient ID: Ronald Chaney, male   DOB: Oct 26, 1945, 68 y.o.   MRN: [id number]   Ronald Chaney, Ronald Chaney 03/30/2013, 7:59 PM

## 2013-03-31 ENCOUNTER — Encounter (HOSPITAL_COMMUNITY): Payer: Self-pay | Admitting: Family

## 2013-03-31 LAB — BASIC METABOLIC PANEL
BUN: 12 mg/dL (ref 6–23)
CHLORIDE: 103 meq/L (ref 96–112)
CO2: 25 mEq/L (ref 19–32)
CREATININE: 0.98 mg/dL (ref 0.50–1.35)
Calcium: 8.2 mg/dL — ABNORMAL LOW (ref 8.4–10.5)
GFR, EST NON AFRICAN AMERICAN: 84 mL/min — AB (ref >90)
Glucose, Bld: 118 mg/dL — ABNORMAL HIGH (ref 70–99)
Potassium: 3.8 mEq/L (ref 3.7–5.3)
Sodium: 138 mEq/L (ref 137–147)

## 2013-03-31 LAB — CBC
HEMATOCRIT: 31.6 % — AB (ref 39.0–52.0)
Hemoglobin: 10.8 g/dL — ABNORMAL LOW (ref 13.0–17.0)
MCH: 30 pg (ref 26.0–34.0)
MCHC: 34.2 g/dL (ref 30.0–36.0)
MCV: 87.8 fL (ref 78.0–100.0)
PLATELETS: 155 10*3/uL (ref 150–400)
RBC: 3.6 MIL/uL — ABNORMAL LOW (ref 4.22–5.81)
RDW: 13.8 % (ref 11.5–15.5)
WBC: 7.7 10*3/uL (ref 4.0–10.5)

## 2013-03-31 MED ORDER — ENOXAPARIN SODIUM 40 MG/0.4ML ~~LOC~~ SOLN: 40.0000 mg | SUBCUTANEOUS | Status: AC

## 2013-03-31 MED ORDER — OXYCODONE HCL 5 MG PO TABS: 5.0000 mg | ORAL_TABLET | ORAL | Status: AC | PRN

## 2013-03-31 MED ORDER — METHOCARBAMOL 500 MG PO TABS: 500.0000 mg | ORAL_TABLET | Freq: Four times a day (QID) | ORAL | Status: DC | PRN

## 2013-03-31 MED ORDER — OXYCODONE HCL ER 10 MG PO T12A: 10.0000 mg | EXTENDED_RELEASE_TABLET | Freq: Two times a day (BID) | ORAL | Status: AC

## 2013-03-31 NOTE — Progress Notes (Signed)
Patient discharged to home accompanied by wife. Discharge instructions and rx given and explained and patient stated understanding. IV was removed and patient left unit in a stable condition via wheelchair. 

## 2013-03-31 NOTE — Anesthesia Postprocedure Evaluation (Signed)
  Anesthesia Post-op Note  Patient: Ronald Chaney  Procedure(s) Performed: Procedure(s): TOTAL KNEE ARTHROPLASTY (Left)  Patient Location: Nursing Unit  Anesthesia Type:GA combined with regional for post-op pain  Level of Consciousness: awake, alert , oriented and patient cooperative  Airway and Oxygen Therapy: Patient Spontanous Breathing and Patient connected to nasal cannula oxygen  Post-op Pain: mild  Post-op Assessment: Post-op Vital signs reviewed, Patient's Cardiovascular Status Stable, Respiratory Function Stable, Patent Airway, No signs of Nausea or vomiting and Pain level controlled  Post-op Vital Signs: Reviewed and stable  Complications: No apparent anesthesia complications

## 2013-03-31 NOTE — Progress Notes (Signed)
Physical Therapy Treatment Patient Details Name: Ronald Chaney MRN: [id number] DOB: Jun 23, 1945 Today's Date: 03/31/2013 Time: [id number] PT Time Calculation (min): 31 min  PT Assessment / Plan / Recommendation  History of Present Illness Patient is a 68 yo male s/p Lt TKA.  Patient had RT TKA in August 2014.   PT Comments     Follow Up Recommendations  Home health PT;Supervision/Assistance - 24 hour     Does the patient have the potential to tolerate intense rehabilitation     Barriers to Discharge        Equipment Recommendations  None recommended by PT    Recommendations for Other Services    Frequency 7X/week   Progress towards PT Goals Progress towards PT goals: Progressing toward goals  Plan Current plan remains appropriate    Precautions / Restrictions Precautions Precautions: Knee Restrictions LLE Weight Bearing: Weight bearing as tolerated   Pertinent Vitals/Pain Pt reports mod L knee pain, repositioned, activities to tolerance, used call bell to request pain meds    Mobility  Transfers Overall transfer level: Needs assistance Equipment used: Rolling walker (2 wheeled) Transfers: Sit to/from Stand Sit to Stand: Supervision General transfer comment: verbal cues for safe technique Ambulation/Gait Ambulation/Gait assistance: Min guard Ambulation Distance (Feet): 80 Feet Assistive device: Rolling walker (2 wheeled) Gait Pattern/deviations: Step-to pattern;Step-through pattern;Antalgic General Gait Details: verbal cues for sequence, RW distance, step length, able to progress to step through pattern    Exercises Total Joint Exercises Ankle Circles/Pumps: AROM;Both;15 reps Quad Sets: AROM;Both;10 reps Short Arc Quad: AROM;10 reps;Left Heel Slides: AAROM;Left;Seated;10 reps Hip ABduction/ADduction: AROM;10 reps;Left   PT Diagnosis:    PT Problem List:   PT Treatment Interventions:     PT Goals (current goals can now be found in the care plan section)     Visit Information  Last PT Received On: 03/31/13 Assistance Needed: +1 History of Present Illness: Patient is a 68 yo male s/p Lt TKA.  Patient had RT TKA in August 2014.    Subjective Data  Subjective: Pt up in bathroom upon entering room.  Pt ambulated in hallway and performed LE exercises in recliner.  Pt plans to d/c home later today after 2nd therapy session which is planned to review safe stair technique.   Cognition  Cognition Arousal/Alertness: Awake/alert Behavior During Therapy: WFL for tasks assessed/performed Overall Cognitive Status: Within Functional Limits for tasks assessed    Balance     End of Session PT - End of Session Activity Tolerance: Patient limited by pain Patient left: in chair;with call bell/phone within reach;with family/visitor present Nurse Communication: Patient requests pain meds (by call bell)   GP     Giorgi Debruin,[name] 03/31/2013, 9:22 AM Zenovia JarredKati Daniel Johndrow, PT, DPT 03/31/2013 Pager: [phone]

## 2013-03-31 NOTE — Op Note (Signed)
TOTAL KNEE REPLACEMENT OPERATIVE NOTE:  03/30/2013  1:26 PM  PATIENT:  Ronald Chaney  68 y.o. male  PRE-OPERATIVE DIAGNOSIS:  osteoarthritis left knee  POST-OPERATIVE DIAGNOSIS:  osteoarthritis left knee  PROCEDURE:  Procedure(s): TOTAL KNEE ARTHROPLASTY  SURGEON:  Surgeon(s): Dannielle HuhSteve Kehlani Vancamp, MD  PHYSICIAN ASSISTANT: Altamese CabalMaurice Jones, [facility]  ANESTHESIA:   general  DRAINS: Hemovac  SPECIMEN: None  COUNTS:  Correct  TOURNIQUET:   Total Tourniquet Time Documented: Thigh (Left) - 52 minutes Total: Thigh (Left) - 52 minutes   DICTATION:  Indication for procedure:    The patient is a 68 y.o. male who has failed conservative treatment for osteoarthritis left knee.  Informed consent was obtained prior to anesthesia. The risks versus benefits of the operation were explain and in a way the patient can, and did, understand.   On the implant demand matching protocol, this patient scored 10.  Therefore, this patient was not receive a polyethylene insert with vitamin E which is a high demand implant.  Description of procedure:     The patient was taken to the operating room and placed under anesthesia.  The patient was positioned in the usual fashion taking care that all body parts were adequately padded and/or protected.  I foley catheter was not placed.  A tourniquet was applied and the leg prepped and draped in the usual sterile fashion.  The extremity was exsanguinated with the esmarch and tourniquet inflated to 350 mmHg.  Pre-operative range of motion was normal.  The knee was in 5 degree of mild varus.  A midline incision approximately 6-7 inches long was made with a #10 blade.  A new blade was used to make a parapatellar arthrotomy going 2-3 cm into the quadriceps tendon, over the patella, and alongside the medial aspect of the patellar tendon.  A synovectomy was then performed with the #10 blade and forceps. I then elevated the deep MCL off the medial tibial metaphysis subperiosteally  around to the semimembranosus attachment.    I everted the patella and used calipers to measure patellar thickness.  I used the reamer to ream down to appropriate thickness to recreate the native thickness.  I then removed excess bone with the rongeur and sagittal saw.  I used the appropriately sized template and drilled the three lug holes.  I then put the trial in place and measured the thickness with the calipers to ensure recreation of the native thickness.  The trial was then removed and the patella subluxed and the knee brought into flexion.  A homan retractor was place to retract and protect the patella and lateral structures.  A Z-retractor was place medially to protect the medial structures.  The extra-medullary alignment system was used to make cut the tibial articular surface perpendicular to the anamotic axis of the tibia and in 3 degrees of posterior slope.  The cut surface and alignment jig was removed.  I then used the intramedullary alignment guide to make a 3 valgus cut on the distal femur.  I then marked out the epicondylar axis on the distal femur.  The posterior condylar axis measured 3 degrees.  I then used the anterior referencing sizer and measured the femur to be a size 11.  The 4-In-1 cutting block was screwed into place in external rotation matching the posterior condylar angle, making our cuts perpendicular to the epicondylar axis.  Anterior, posterior and chamfer cuts were made with the sagittal saw.  The cutting block and cut pieces were removed.  A lamina  spreader was placed in 90 degrees of flexion.  The ACL, PCL, menisci, and posterior condylar osteophytes were removed.  A 10 mm spacer blocked was found to offer good flexion and extension gap balance after minimal in degree releasing.   The scoop retractor was then placed and the femoral finishing block was pinned in place.  The small sagittal saw was used as well as the lug drill to finish the femur.  The block and cut  surfaces were removed and the medullary canal hole filled with autograft bone from the cut pieces.  The tibia was delivered forward in deep flexion and external rotation.  A size E tray was selected and pinned into place centered on the medial 1/3 of the tibial tubercle.  The reamer and keel was used to prepare the tibia through the tray.    I then trialed with the size 11 femur, size E tibia, a 11 mm insert and the 35 patella.  I had excellent flexion/extension gap balance, excellent patella tracking.  Flexion was full and beyond 120 degrees; extension was zero.  These components were chosen and the staff opened them to me on the back table while the knee was lavaged copiously and the cement mixed.  The soft tissue was infiltrated with 60cc of exparel 1.3% through a 21 gauge needle.  I cemented in the components and removed all excess cement.  The polyethylene tibial component was snapped into place and the knee placed in extension while cement was hardening.  The capsule was infilltrated with 30cc of .25% Marcaine with epinephrine.  A hemovac was place in the joint exiting superolaterally.  A pain pump was place superomedially superficial to the arthrotomy.  Once the cement was hard, the tourniquet was let down.  Hemostasis was obtained.  The arthrotomy was closed with figure-8 #1 vicryl sutures.  The deep soft tissues were closed with #0 vicryls and the subcuticular layer closed with a running #2-0 vicryl.  The skin was reapproximated and closed with skin staples.  The wound was dressed with xeroform, 4 x4's, 2 ABD sponges, a single layer of webril and a TED stocking.   The patient was then awakened, extubated, and taken to the recovery room in stable condition.  BLOOD LOSS:  300cc DRAINS: 1 hemovac, 1 pain catheter COMPLICATIONS:  None.  PLAN OF CARE: Admit to inpatient   PATIENT DISPOSITION:  PACU - hemodynamically stable.   Delay start of Pharmacological VTE agent (>24hrs) due to surgical  blood loss or risk of bleeding:  not applicable  Please fax a copy of this op note to my office at [phone] (please only include page 1 and 2 of the Case Information op note)

## 2013-03-31 NOTE — Progress Notes (Signed)
Physical Therapy Treatment Note   03/31/13 1400  PT Visit Information  Last PT Received On 03/31/13  Assistance Needed +1  History of Present Illness Patient is a 68 yo male s/p Lt TKA.  Patient had RT TKA in August 2014.  PT Time Calculation  PT Start Time 1411  PT Stop Time 1428  PT Time Calculation (min) 17 min  Subjective Data  Subjective Pt agreeable to ambulate and practice steps prior to d/c home later today.  Pt and spouse educated on safe stair technique and had no further questions/concerns.  Precautions  Precautions Knee  Restrictions  LLE Weight Bearing WBAT  Cognition  Arousal/Alertness Awake/alert  Behavior During Therapy WFL for tasks assessed/performed  Overall Cognitive Status Within Functional Limits for tasks assessed  Bed Mobility  Overal bed mobility Modified Independent  Bed Mobility Supine to Sit;Sit to Supine  Supine to sit Modified independent (Device/Increase time)  Sit to supine Modified independent (Device/Increase time)  General bed mobility comments self assisted L LE  Transfers  Overall transfer level Needs assistance  Equipment used Rolling walker (2 wheeled)  Transfers Sit to/from Stand  Sit to Stand Supervision  General transfer comment verbal cues for safe technique  Ambulation/Gait  Ambulation/Gait assistance Supervision  Ambulation Distance (Feet) 300 Feet  Assistive device Rolling walker (2 wheeled)  Gait Pattern/deviations Step-through pattern;Antalgic;Decreased step length - left;Decreased dorsiflexion - left  General Gait Details verbal cues for step length, RW distance, heel strike, spouse followed with recliner however pt reported not needed  Stairs Yes  Stairs assistance Min guard  Stair Management Step to pattern;One rail Left;Forwards (also provided 1 HHA to mimick 2nd rail (reachable rails home)  Number of Stairs 6  General stair comments verbal cues for sequence, safety, provided 1 HHA to mimck 2nd reachable rail at home, pt  tolerated well, spouse present and educated as well  PT - End of Session  Equipment Utilized During Treatment Gait belt  Activity Tolerance Patient tolerated treatment well  Patient left in bed;with call bell/phone within reach;with family/visitor present  PT - Assessment/Plan  PT Plan Current plan remains appropriate  PT Frequency 7X/week  Follow Up Recommendations Home health PT;Supervision/Assistance - 24 hour  PT equipment None recommended by PT  PT Goal Progression  Progress towards PT goals Progressing toward goals  PT General Charges  $$ ACUTE PT VISIT 1 Procedure  PT Treatments  $Gait Training 8-22 mins   Pt premedicated for session.  Zenovia JarredKati Jameila Keeny, PT, DPT 03/31/2013 Pager: [phone]

## 2013-03-31 NOTE — Progress Notes (Signed)
SPORTS MEDICINE AND JOINT REPLACEMENT  Ronald SpurlingStephen Lucey, MD   Ronald CabalMaurice Maat Kafer, PA-C [address], [place], [place]  [address]                             [phone]   PROGRESS NOTE  Subjective:  negative for Chest Pain  negative for Shortness of Breath  negative for Nausea/Vomiting   negative for Calf Pain  negative for Bowel Movement   Tolerating Diet: yes         Patient reports pain as 5 on 0-10 scale.    Objective: Vital signs in last 24 hours:   Patient Vitals for the past 24 hrs:  BP Temp Temp src Pulse Resp SpO2  03/31/13 0555 110/68 mmHg 97.8 F (36.6 C) Oral 74 18 98 %  03/31/13 0400 - - - - 18 100 %  03/31/13 0127 121/73 mmHg 98.2 F (36.8 C) Oral 71 18 100 %  03/31/13 0000 - - - - 18 99 %  03/30/13 2020 106/55 mmHg 98.2 F (36.8 C) Oral 89 18 98 %  03/30/13 2000 - - - - 18 98 %  03/30/13 1337 133/78 mmHg 97.6 F (36.4 C) - 73 16 96 %  03/30/13 1305 - 98.7 F (37.1 C) - - - -  03/30/13 1303 123/75 mmHg - - 77 13 98 %  03/30/13 1300 - - - 76 14 99 %  03/30/13 1249 - - - 70 9 97 %  03/30/13 1248 130/82 mmHg - - 71 8 98 %  03/30/13 1245 - - - 72 12 100 %  03/30/13 1233 129/83 mmHg - - 70 11 98 %  03/30/13 1230 - - - 70 21 96 %    @flow {1959:LAST@   Intake/Output from previous day:   03/02 0701 - 03/03 0700 In: 2493.8 [P.O.:200; I.V.:2293.8] Out: 1375 [Urine:550; Drains:675]   Intake/Output this shift:   03/03 0701 - 03/03 1900 In: 320 [P.O.:320] Out: -    Intake/Output     03/02 0701 - 03/03 0700 03/03 0701 - 03/04 0700   P.O. 200 320   I.V. 2293.8    Total Intake 2493.8 320   Urine 550    Drains 675    Blood 150    Total Output 1375     Net +1118.8 +320        Urine Occurrence 1 x       LABORATORY DATA:  Recent Labs  03/30/13 1707 03/31/13 0350  WBC 12.2* 7.7  HGB 13.0 10.8*  HCT 38.3* 31.6*  PLT 201 155    Recent Labs  03/30/13 1707 03/31/13 0350  NA  --  138  K  --  3.8  CL  --  103  CO2  --  25  BUN  --  12   CREATININE 0.87 0.98  GLUCOSE  --  118*  CALCIUM  --  8.2*   Lab Results  Component Value Date   INR 1.04 03/20/2013   INR 0.99 09/05/2012    Examination:  General appearance: alert, cooperative and no distress Extremities: Homans sign is negative, no sign of DVT  Wound Exam: clean, dry, intact   Drainage:  Scant/small amount Serosanguinous exudate  Motor Exam: EHL and FHL Intact  Sensory Exam: Deep Peroneal normal   Assessment:    1 Day Post-Op  Procedure(s) (LRB): TOTAL KNEE ARTHROPLASTY (Left)  ADDITIONAL DIAGNOSIS:  Active Problems:   S/P total knee arthroplasty  Acute Blood Loss Anemia   Plan: Physical Therapy as ordered Weight Bearing as Tolerated (WBAT)  DVT Prophylaxis:  Lovenox  DISCHARGE PLAN: Home  DISCHARGE NEEDS: HHPT, CPM, Walker and 3-in-1 comode seat         Ronald Chaney 03/31/2013, 12:26 PM

## 2013-03-31 NOTE — Discharge Instructions (Signed)
Diet: As you were doing prior to hospitalization   Activity:  Increase activity slowly as tolerated                  No lifting or driving for 6 weeks  Shower:  May shower without a dressing once there is no drainage from your wound.                 Do NOT wash over the wound.                 Dressing:  You may change your dressing on Wednesday                    Then change the dressing daily with sterile 4"x4"s gauze dressing                     And TED hose for knees.  Weight Bearing:  Weight bearing as tolerated as taught in physical therapy.  Use a                                walker or Crutches as instructed.  To prevent constipation: you may use a stool softener such as -               Colace ( over the counter) 100 mg by mouth twice a day                Drink plenty of fluids ( prune juice may be helpful) and high fiber foods                Miralax ( over the counter) for constipation as needed.    Precautions:  If you experience chest pain or shortness of breath - call 911 immediately               For transfer to the hospital emergency department!!               If you develop a fever greater that 101 F, purulent drainage from wound,                             increased redness or drainage from wound, or calf pain -- Call the office.  Follow- Up Appointment:  Please call for an appointment to be seen on 03/31/13                                              [facility] office:  [phone]            [address] [place], [place] [address]

## 2013-03-31 NOTE — Anesthesia Postprocedure Evaluation (Signed)
  Anesthesia Post-op Note  Patient: Ronald Chaney  Procedure(s) Performed: Procedure(s): TOTAL KNEE ARTHROPLASTY (Left)  Patient Location: PACU  Anesthesia Type:General and Regional  Level of Consciousness: awake, alert  and oriented  Airway and Oxygen Therapy: Patient Spontanous Breathing and Patient connected to nasal cannula oxygen  Post-op Pain: moderate  Post-op Assessment: Post-op Vital signs reviewed, Patient's Cardiovascular Status Stable, Respiratory Function Stable, Patent Airway, No signs of Nausea or vomiting and Pain level controlled  Post-op Vital Signs: Reviewed and stable  Complications: No apparent anesthesia complications

## 2013-03-31 NOTE — Evaluation (Signed)
Occupational Therapy Evaluation Patient Details Name: Ronald Chaney MRN: [id number] DOB: 13-Jan-1946 Today's Date: 03/31/2013 Time: 8295-6213 OT Time Calculation (min): 18 min  OT Assessment / Plan / Recommendation History of present illness Patient is a 68 yo male s/p Lt TKA.  Patient had RT TKA in August 2014.   Clinical Impression   Pt admitted s/p L TKA. He will need supervision-min assist at d/c for LB bathe/dress and shower transfers. Pt reports that he has DME & both pt/wife report no further acute OT needs at this time secondary to recent R TKA in August 2014. Will sign off acute OT.     OT Assessment  Patient does not need any further OT services    Follow Up Recommendations  No OT follow up;Supervision - Intermittent    Barriers to Discharge      Equipment Recommendations  None recommended by OT    Recommendations for Other Services    Frequency       Precautions / Restrictions Precautions Precautions: Knee Restrictions Weight Bearing Restrictions: Yes LLE Weight Bearing: Weight bearing as tolerated   Pertinent Vitals/Pain 7/10 L knee pain. RN made aware, repositioned, ice applied & resting in bed.    ADL  Eating/Feeding: Simulated;Independent Where Assessed - Eating/Feeding: Bed level Grooming: Simulated;Modified independent Where Assessed - Grooming: Unsupported sitting Upper Body Bathing: Simulated;Set up Where Assessed - Upper Body Bathing: Unsupported sitting Lower Body Bathing: Simulated;Min guard Where Assessed - Lower Body Bathing: Supported sit to stand Upper Body Dressing: Simulated;Modified independent Where Assessed - Upper Body Dressing: Unsupported sitting Lower Body Dressing: Simulated;Min guard Where Assessed - Lower Body Dressing: Supported sit to stand Toilet Transfer: Research scientist (life sciences) Method: Sit to Barista: Raised toilet seat with arms (or 3-in-1 over toilet) Toileting - Clothing  Manipulation and Hygiene: Performed;Supervision/safety Tub/Shower Transfer: Simulated;Supervision/safety;Min guard (Simulated shower transfer stepping up/over & in/out shower using towel roll) Tub/Shower Transfer Method: Science writer: Walk in Scientist, research (physical sciences) Used: Rolling walker;Other (comment) (3:1 over toilet) Transfers/Ambulation Related to ADLs: Pt overall supervision-min guard asssit for functional mobility and simulated shower transfer using RW & 3:1 over toilet. ADL Comments: Pt was educated in role of OT. He reports that he has DME from recent R TKA in Aug 2014. Pt participated in ADL training session for toilet & shower transfers, bed mobility, and ADL's. Pt & wife report they have no further acute OT needs at this time as she will be able to provide PRN assist at d/c.    OT Diagnosis:    OT Problem List:   OT Treatment Interventions:     OT Goals(Current goals can be found in the care plan section) Acute Rehab OT Goals Patient Stated Goal: I may go home later today  Visit Information  Last OT Received On: 03/31/13 Assistance Needed: +1 History of Present Illness: Patient is a 68 yo male s/p Lt TKA.  Patient had RT TKA in August 2014.       Prior Functioning     Home Living Family/patient expects to be discharged to:: Private residence Living Arrangements: Spouse/significant other Available Help at Discharge: Family;Available 24 hours/day Type of Home: House Home Access: Stairs to enter [employer] of Steps: 12 Entrance Stairs-Rails: Right Home Layout: Two level;Bed/bath upstairs Home Equipment: Walker - 2 wheels;Bedside commode;Grab bars - tub/shower Prior Function Level of Independence: Independent Communication Communication: No difficulties Dominant Hand: Right    Vision/Perception Vision - History Baseline Vision: Wears glasses all the time Patient Visual  Report: No change from baseline   Cognition   Cognition Arousal/Alertness: Awake/alert Behavior During Therapy: WFL for tasks assessed/performed Overall Cognitive Status: Within Functional Limits for tasks assessed    Extremity/Trunk Assessment Upper Extremity Assessment Upper Extremity Assessment: Overall WFL for tasks assessed Lower Extremity Assessment Lower Extremity Assessment: Defer to PT evaluation Cervical / Trunk Assessment Cervical / Trunk Assessment: Normal    Mobility Bed Mobility Overal bed mobility: Modified Independent Bed Mobility: Supine to Sit;Sit to Supine Supine to sit: Modified independent (Device/Increase time) Sit to supine: Modified independent (Device/Increase time) General bed mobility comments: Pt used R LE at left ankle and lift/move in/out of bed at mod I level. Transfers Overall transfer level: Needs assistance Equipment used: Rolling walker (2 wheeled) Transfers: Sit to/from Stand Sit to Stand: Supervision General transfer comment: verbal cues for safe technique      Balance Balance Overall balance assessment: No apparent balance deficits (not formally assessed)   End of Session OT - End of Session Equipment Utilized During Treatment: Rolling walker Activity Tolerance: Patient tolerated treatment well Patient left: in bed;with call bell/phone within reach;with family/visitor present Nurse Communication: Mobility status;Patient requests pain meds CPM Left Knee CPM Left Knee: Off Additional Comments: Trapeze bar and foot roll  GO     [name] [name] 03/31/2013, 10:15 AM

## 2013-04-01 ENCOUNTER — Encounter (HOSPITAL_COMMUNITY): Payer: Self-pay | Admitting: Orthopedic Surgery

## 2013-04-01 NOTE — Care Management Note (Signed)
CARE MANAGEMENT NOTE 04/01/2013  Patient:  Ronald Chaney,Ronald Chaney   Account Number:  [id number]  Date Initiated:  03/31/2013  Documentation initiated by:  Vance PeperBRADY,Kebron Pulse  Subjective/Objective Assessment:   68 yr old male s/p left total knee arthroplasty.     Action/Plan:   Patient preoperatively setup with [facility], no changes. Rolling walker, 3in1 and CPM have been delivered to patient's home.   Anticipated DC Date:  03/31/2013   Anticipated DC Plan:  HOME W HOME HEALTH SERVICES      DC Planning Services  CM consult      PAC Choice  DURABLE MEDICAL EQUIPMENT  HOME HEALTH   Choice offered to / List presented to:  C-1 Patient   DME arranged  WALKER - ROLLING  3-N-1  CPM      DME agency  [employer]     HH arranged  HH-2 PT      [facility]   Status of service:  Completed, signed off Medicare Important Message given?   (If response is "NO", the following Medicare IM given date fields will be blank) Date Medicare IM given:   Date Additional Medicare IM given:    Discharge Disposition:  HOME W HOME HEALTH SERVICES

## 2013-04-01 NOTE — Discharge Summary (Signed)
SPORTS MEDICINE & JOINT REPLACEMENT   Georgena Spurling, MD   Altamese Cabal, PA-C [address] [place], [place], [place]  [address]                             [phone]  PATIENT ID: Ronald Chaney        MRN:  [id number]          DOB/AGE: 1945/06/17 / 69 y.o.    DISCHARGE SUMMARY  ADMISSION DATE:    03/30/2013 DISCHARGE DATE:  3/32015  ADMISSION DIAGNOSIS: osteoarthritis left knee    DISCHARGE DIAGNOSIS:  osteoarthritis left knee    ADDITIONAL DIAGNOSIS: Active Problems:   S/P total knee arthroplasty  Past Medical History  Diagnosis Date  . Arthritis   . Anxiety   . Depression   . GERD (gastroesophageal reflux disease)     occ    PROCEDURE: Procedure(s): TOTAL KNEE ARTHROPLASTY on 03/30/2013  CONSULTS:     HISTORY:  See H&P in chart  HOSPITAL COURSE:  Ronald Chaney is a 68 y.o. admitted on 03/30/2013 and found to have a diagnosis of osteoarthritis left knee.  After appropriate laboratory studies were obtained  they were taken to the operating room on 03/30/2013 and underwent Procedure(s): TOTAL KNEE ARTHROPLASTY.   They were given perioperative antibiotics:  Anti-infectives   Start     Dose/Rate Route Frequency Ordered Stop   03/30/13 1600  ceFAZolin (ANCEF) IVPB 2 g/50 mL premix     2 g 100 mL/hr over 30 Minutes Intravenous Every 6 hours 03/30/13 1336 03/30/13 2202   03/30/13 0600  ceFAZolin (ANCEF) IVPB 2 g/50 mL premix     2 g 100 mL/hr over 30 Minutes Intravenous On call to O.R. 03/29/13 1303 03/30/13 0948    .  Tolerated the procedure well.  Placed with a foley intraoperatively.  Given Ofirmev at induction and for 48 hours.    POD# 1: Vital signs were stable.  Patient denied Chest pain, shortness of breath, or calf pain.  Patient was started on Lovenox 30 mg subcutaneously twice daily at 8am.  Consults to PT, OT, and care management were made.  The patient was weight bearing as tolerated.  CPM was placed on the operative leg 0-90 degrees for 6-8 hours a day.   Incentive spirometry was taught.  Dressing was changed.  Marcaine pump and hemovac were discontinued.      POD #2, Continued  PT for ambulation and exercise program.  IV saline locked.  O2 discontinued.    The remainder of the hospital course was dedicated to ambulation and strengthening.   The patient was discharged on 1 day post op in  Good condition.  Blood products given:none  DIAGNOSTIC STUDIES: Recent vital signs: Patient Vitals for the past 24 hrs:  BP Temp Temp src Pulse Resp SpO2  03/31/13 1500 114/61 mmHg 98.6 F (37 C) Oral 78 18 96 %       Recent laboratory studies:  Recent Labs  03/30/13 1707 03/31/13 0350  WBC 12.2* 7.7  HGB 13.0 10.8*  HCT 38.3* 31.6*  PLT 201 155    Recent Labs  03/30/13 1707 03/31/13 0350  NA  --  138  K  --  3.8  CL  --  103  CO2  --  25  BUN  --  12  CREATININE 0.87 0.98  GLUCOSE  --  118*  CALCIUM  --  8.2*   Lab Results  Component Value Date   INR 1.04 03/20/2013   INR 0.99 09/05/2012     Recent Radiographic Studies :  No results found.  DISCHARGE INSTRUCTIONS: Discharge Orders   Future Orders Complete By Expires   Call MD / Call 911  As directed    Comments:     If you experience chest pain or shortness of breath, CALL 911 and be transported to the hospital emergency room.  If you develope a fever above 101 F, pus (white drainage) or increased drainage or redness at the wound, or calf pain, call your surgeon's office.   Change dressing  As directed    Comments:     Change dressing on wednesday, then change the dressing daily with sterile 4 x 4 inch gauze dressing and apply TED hose.   Constipation Prevention  As directed    Comments:     Drink plenty of fluids.  Prune juice may be helpful.  You may use a stool softener, such as Colace (over the counter) 100 mg twice a day.  Use MiraLax (over the counter) for constipation as needed.   CPM  As directed    Comments:     Continuous passive motion machine (CPM):       Use the CPM from 0 to 90 for 6-8 hours per day.      You may increase by 10 per day.  You may break it up into 2 or 3 sessions per day.      Use CPM for 2 weeks or until you are told to stop.   Diet - low sodium heart healthy  As directed    Do not put a pillow under the knee. Place it under the heel.  As directed    Driving restrictions  As directed    Comments:     No driving for 6 weeks   Increase activity slowly as tolerated  As directed    Lifting restrictions  As directed    Comments:     No lifting for 6 weeks   TED hose  As directed    Comments:     Use stockings (TED hose) for 3 weeks on both leg(s).  You may remove them at night for sleeping.      DISCHARGE MEDICATIONS:     Medication List    STOP taking these medications       HYDROcodone-acetaminophen 10-325 MG per tablet  Commonly known as:  NORCO     HYDROmorphone 2 MG tablet  Commonly known as:  DILAUDID     traMADol 50 MG tablet  Commonly known as:  ULTRAM      TAKE these medications       docusate sodium 100 MG capsule  Commonly known as:  COLACE  Take 100 mg by mouth daily.     enoxaparin 40 MG/0.4ML injection  Commonly known as:  LOVENOX  Inject 0.4 mLs (40 mg total) into the skin daily.     ibuprofen 200 MG tablet  Commonly known as:  ADVIL,MOTRIN  Take 600 mg by mouth at bedtime as needed for pain.     ICAPS MV PO  Take by mouth daily at 2 PM daily at 2 PM. 1-2 daily     ICAPS PO  Take 1 tablet by mouth 2 (two) times daily.     methocarbamol 500 MG tablet  Commonly known as:  ROBAXIN  Take 1-2 tablets (500-1,000 mg total) by mouth every 6 (six) hours as needed for  muscle spasms.     oxyCODONE 5 MG immediate release tablet  Commonly known as:  Oxy IR/ROXICODONE  Take 1-2 tablets (5-10 mg total) by mouth every 3 (three) hours as needed for breakthrough pain.     OxyCODONE 10 mg T12a 12 hr tablet  Commonly known as:  OXYCONTIN  Take 1 tablet (10 mg total) by mouth every 12 (twelve)  hours.     sertraline 50 MG tablet  Commonly known as:  ZOLOFT  Take 50 mg by mouth every morning.        FOLLOW UP VISIT:       Follow-up Information   Follow up with Raymon MuttonLUCEY,STEPHEN D, MD. Call on 04/14/2013.   Specialty:  Orthopedic Surgery   Contact information:   [address] [place] [address] [phone]       DISPOSITION: HOME   CONDITION:  Good   Ronique Simerly 04/01/2013, 12:33 PM

## 2017-02-13 ENCOUNTER — Other Ambulatory Visit: Payer: Self-pay | Admitting: Family Medicine

## 2017-02-13 DIAGNOSIS — M5134 Other intervertebral disc degeneration, thoracic region: Secondary | ICD-10-CM

## 2017-02-27 ENCOUNTER — Other Ambulatory Visit: Payer: Commercial Managed Care - PPO

## 2017-06-27 ENCOUNTER — Other Ambulatory Visit: Payer: Self-pay | Admitting: Family Medicine

## 2017-06-27 DIAGNOSIS — Z0000 Encounter for general adult medical examination without abnormal findings: Secondary | ICD-10-CM

## 2017-06-27 DIAGNOSIS — Z96651 Presence of right artificial knee joint: Secondary | ICD-10-CM

## 2017-06-27 DIAGNOSIS — M5134 Other intervertebral disc degeneration, thoracic region: Secondary | ICD-10-CM

## 2017-06-27 DIAGNOSIS — Z13820 Encounter for screening for osteoporosis: Secondary | ICD-10-CM

## 2017-07-03 ENCOUNTER — Ambulatory Visit (INDEPENDENT_AMBULATORY_CARE_PROVIDER_SITE_OTHER): Payer: Medicare Other

## 2017-07-03 ENCOUNTER — Other Ambulatory Visit: Payer: Medicare Other

## 2017-07-03 DIAGNOSIS — Z96641 Presence of right artificial hip joint: Secondary | ICD-10-CM | POA: Diagnosis not present

## 2017-07-03 DIAGNOSIS — Z8262 Family history of osteoporosis: Secondary | ICD-10-CM

## 2017-07-03 DIAGNOSIS — M5134 Other intervertebral disc degeneration, thoracic region: Secondary | ICD-10-CM

## 2018-02-19 DIAGNOSIS — F411 Generalized anxiety disorder: Secondary | ICD-10-CM | POA: Diagnosis not present

## 2018-02-19 DIAGNOSIS — F5101 Primary insomnia: Secondary | ICD-10-CM | POA: Diagnosis not present

## 2018-02-19 DIAGNOSIS — Z96641 Presence of right artificial hip joint: Secondary | ICD-10-CM | POA: Diagnosis not present

## 2018-02-19 DIAGNOSIS — Z0000 Encounter for general adult medical examination without abnormal findings: Secondary | ICD-10-CM | POA: Diagnosis not present

## 2018-02-19 DIAGNOSIS — M7661 Achilles tendinitis, right leg: Secondary | ICD-10-CM | POA: Diagnosis not present

## 2018-02-19 DIAGNOSIS — M150 Primary generalized (osteo)arthritis: Secondary | ICD-10-CM | POA: Diagnosis not present

## 2018-03-06 DIAGNOSIS — Z471 Aftercare following joint replacement surgery: Secondary | ICD-10-CM | POA: Diagnosis not present

## 2018-03-06 DIAGNOSIS — Z96641 Presence of right artificial hip joint: Secondary | ICD-10-CM | POA: Diagnosis not present

## 2018-08-20 DIAGNOSIS — G894 Chronic pain syndrome: Secondary | ICD-10-CM | POA: Diagnosis not present

## 2018-08-20 DIAGNOSIS — Z96641 Presence of right artificial hip joint: Secondary | ICD-10-CM | POA: Diagnosis not present

## 2018-08-20 DIAGNOSIS — F329 Major depressive disorder, single episode, unspecified: Secondary | ICD-10-CM | POA: Diagnosis not present

## 2018-08-20 DIAGNOSIS — M8949 Other hypertrophic osteoarthropathy, multiple sites: Secondary | ICD-10-CM | POA: Diagnosis not present

## 2018-08-20 DIAGNOSIS — F411 Generalized anxiety disorder: Secondary | ICD-10-CM | POA: Diagnosis not present

## 2018-08-20 DIAGNOSIS — L309 Dermatitis, unspecified: Secondary | ICD-10-CM | POA: Diagnosis not present

## 2018-08-20 DIAGNOSIS — F5101 Primary insomnia: Secondary | ICD-10-CM | POA: Diagnosis not present

## 2018-11-06 ENCOUNTER — Emergency Department (HOSPITAL_COMMUNITY): Payer: No Typology Code available for payment source

## 2018-11-06 ENCOUNTER — Other Ambulatory Visit: Payer: Self-pay

## 2018-11-06 ENCOUNTER — Encounter (HOSPITAL_COMMUNITY): Payer: Self-pay | Admitting: Emergency Medicine

## 2018-11-06 ENCOUNTER — Emergency Department (HOSPITAL_COMMUNITY)
Admission: EM | Admit: 2018-11-06 | Discharge: 2018-11-06 | Disposition: A | Discharge status: Home / Self Care | Payer: No Typology Code available for payment source | Attending: Emergency Medicine | Admitting: Emergency Medicine

## 2018-11-06 DIAGNOSIS — Y999 Unspecified external cause status: Secondary | ICD-10-CM | POA: Insufficient documentation

## 2018-11-06 DIAGNOSIS — M542 Cervicalgia: Secondary | ICD-10-CM | POA: Insufficient documentation

## 2018-11-06 DIAGNOSIS — M545 Low back pain, unspecified: Secondary | ICD-10-CM

## 2018-11-06 DIAGNOSIS — R0781 Pleurodynia: Secondary | ICD-10-CM | POA: Diagnosis not present

## 2018-11-06 DIAGNOSIS — S0003XA Contusion of scalp, initial encounter: Secondary | ICD-10-CM | POA: Insufficient documentation

## 2018-11-06 DIAGNOSIS — Z79899 Other long term (current) drug therapy: Secondary | ICD-10-CM | POA: Insufficient documentation

## 2018-11-06 DIAGNOSIS — Y929 Unspecified place or not applicable: Secondary | ICD-10-CM | POA: Diagnosis not present

## 2018-11-06 DIAGNOSIS — S299XXA Unspecified injury of thorax, initial encounter: Secondary | ICD-10-CM | POA: Diagnosis not present

## 2018-11-06 DIAGNOSIS — S199XXA Unspecified injury of neck, initial encounter: Secondary | ICD-10-CM | POA: Diagnosis not present

## 2018-11-06 DIAGNOSIS — Y939 Activity, unspecified: Secondary | ICD-10-CM | POA: Diagnosis not present

## 2018-11-06 MED ORDER — METHOCARBAMOL 500 MG PO TABS: 500.0000 mg | ORAL_TABLET | Freq: Two times a day (BID) | ORAL | 0 refills | Status: AC

## 2018-11-06 MED ORDER — ACETAMINOPHEN 500 MG PO TABS
1000.0000 mg | ORAL_TABLET | Freq: Once | ORAL | Status: AC
Start: 2018-11-06 — End: 2018-11-06
  Administered 2018-11-06: 1000 mg via ORAL
  Filled 2018-11-06: qty 2
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
## 2018-11-06 NOTE — Discharge Instructions (Signed)
The pain you are experiencing is likely due to muscle strain, your imaging is reassuring.  You may take Robaxin and Tylenol 650 mg as needed for pain in addition to your home pain medication.  Use caution while taking these medications as it can cause drowsiness, do not drive on these medications.  You may also use ice and heat, and over-the-counter remedies such as Biofreeze gel or salon pas lidocaine patches. The muscle soreness should improve over the next week. Follow up with your family doctor in the next week for a recheck if you are still having symptoms. Return to ED if pain is worsening, you develop weakness or numbness of extremities, or new or concerning symptoms develop.
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
## 2018-11-20 DIAGNOSIS — S20212D Contusion of left front wall of thorax, subsequent encounter: Secondary | ICD-10-CM | POA: Diagnosis not present

## 2018-11-20 DIAGNOSIS — F329 Major depressive disorder, single episode, unspecified: Secondary | ICD-10-CM | POA: Diagnosis not present

## 2018-12-23 DIAGNOSIS — H25811 Combined forms of age-related cataract, right eye: Secondary | ICD-10-CM | POA: Diagnosis not present

## 2018-12-23 DIAGNOSIS — H2511 Age-related nuclear cataract, right eye: Secondary | ICD-10-CM | POA: Diagnosis not present

## 2019-02-23 DIAGNOSIS — S61412A Laceration without foreign body of left hand, initial encounter: Secondary | ICD-10-CM | POA: Diagnosis not present

## 2019-02-25 DIAGNOSIS — M8949 Other hypertrophic osteoarthropathy, multiple sites: Secondary | ICD-10-CM | POA: Diagnosis not present

## 2019-02-25 DIAGNOSIS — L821 Other seborrheic keratosis: Secondary | ICD-10-CM | POA: Diagnosis not present

## 2019-02-25 DIAGNOSIS — L570 Actinic keratosis: Secondary | ICD-10-CM | POA: Diagnosis not present

## 2019-02-25 DIAGNOSIS — G894 Chronic pain syndrome: Secondary | ICD-10-CM | POA: Diagnosis not present

## 2019-02-25 DIAGNOSIS — F5101 Primary insomnia: Secondary | ICD-10-CM | POA: Diagnosis not present

## 2019-02-25 DIAGNOSIS — S61412D Laceration without foreign body of left hand, subsequent encounter: Secondary | ICD-10-CM | POA: Diagnosis not present

## 2019-02-25 DIAGNOSIS — Z0000 Encounter for general adult medical examination without abnormal findings: Secondary | ICD-10-CM | POA: Diagnosis not present

## 2019-02-25 DIAGNOSIS — F411 Generalized anxiety disorder: Secondary | ICD-10-CM | POA: Diagnosis not present

## 2019-03-04 DIAGNOSIS — L570 Actinic keratosis: Secondary | ICD-10-CM | POA: Diagnosis not present

## 2019-03-04 DIAGNOSIS — L821 Other seborrheic keratosis: Secondary | ICD-10-CM | POA: Diagnosis not present

## 2019-03-14 ENCOUNTER — Ambulatory Visit: Payer: Medicare Other | Attending: Internal Medicine

## 2019-03-14 DIAGNOSIS — Z23 Encounter for immunization: Secondary | ICD-10-CM | POA: Insufficient documentation

## 2019-03-14 NOTE — Progress Notes (Signed)
   Covid-19 Vaccination Clinic  Name:  Ronald Chaney    MRN: [id number] DOB: 19-Sep-1945  03/14/2019  [name] was observed post Covid-19 immunization for  15 minutes without incidence. He was provided with Vaccine Information Sheet and instruction to access the V-Safe system.   [name] was instructed to call 911 with any severe reactions post vaccine: Marland Kitchen Difficulty breathing  . Swelling of your face and throat  . A fast heartbeat  . A bad rash all over your body  . Dizziness and weakness    Immunizations Administered    Name Date Dose VIS Date Route   Pfizer COVID-19 Vaccine 03/14/2019  9:10 AM 0.3 mL 01/09/2019 Intramuscular   Manufacturer: [employer], [employer]   Lot: [id number]   NDC: [address]-0

## 2019-04-05 ENCOUNTER — Ambulatory Visit: Payer: Medicare Other | Attending: Internal Medicine

## 2019-04-05 DIAGNOSIS — Z23 Encounter for immunization: Secondary | ICD-10-CM | POA: Insufficient documentation

## 2019-04-05 NOTE — Progress Notes (Signed)
   Covid-19 Vaccination Clinic  Name:  Ronald Chaney    MRN: [id number] DOB: [birth year]  04/05/2019  Mr. Ronald Chaney was observed post Covid-19 immunization for 15 minutes without incident. He was provided with Vaccine Information Sheet and instruction to access the V-Safe system.   Mr. Ronald Chaney was instructed to call 911 with any severe reactions post vaccine: Marland Kitchen Difficulty breathing  . Swelling of face and throat  . A fast heartbeat  . A bad rash all over body  . Dizziness and weakness   Immunizations Administered    Name Date Dose VIS Date Route   Pfizer COVID-19 Vaccine 04/05/2019  1:11 PM 0.3 mL 01/09/2019 Intramuscular   Manufacturer: [employer], [employer]   Lot: [id number]   NDC: [address]-9

## 2019-08-27 DIAGNOSIS — F411 Generalized anxiety disorder: Secondary | ICD-10-CM | POA: Diagnosis not present

## 2019-08-27 DIAGNOSIS — L821 Other seborrheic keratosis: Secondary | ICD-10-CM | POA: Diagnosis not present

## 2019-08-27 DIAGNOSIS — F329 Major depressive disorder, single episode, unspecified: Secondary | ICD-10-CM | POA: Diagnosis not present

## 2019-08-27 DIAGNOSIS — G894 Chronic pain syndrome: Secondary | ICD-10-CM | POA: Diagnosis not present

## 2019-12-23 DIAGNOSIS — H2512 Age-related nuclear cataract, left eye: Secondary | ICD-10-CM | POA: Diagnosis not present

## 2019-12-23 DIAGNOSIS — H524 Presbyopia: Secondary | ICD-10-CM | POA: Diagnosis not present

## 2019-12-23 DIAGNOSIS — H18513 Endothelial corneal dystrophy, bilateral: Secondary | ICD-10-CM | POA: Diagnosis not present

## 2019-12-23 DIAGNOSIS — H4720 Unspecified optic atrophy: Secondary | ICD-10-CM | POA: Diagnosis not present

## 2020-02-24 DIAGNOSIS — Z79899 Other long term (current) drug therapy: Secondary | ICD-10-CM | POA: Diagnosis not present

## 2020-02-24 DIAGNOSIS — Z13228 Encounter for screening for other metabolic disorders: Secondary | ICD-10-CM | POA: Diagnosis not present

## 2020-02-24 DIAGNOSIS — G894 Chronic pain syndrome: Secondary | ICD-10-CM | POA: Diagnosis not present

## 2020-02-24 DIAGNOSIS — F411 Generalized anxiety disorder: Secondary | ICD-10-CM | POA: Diagnosis not present

## 2020-02-24 DIAGNOSIS — Z0001 Encounter for general adult medical examination with abnormal findings: Secondary | ICD-10-CM | POA: Diagnosis not present

## 2020-02-24 DIAGNOSIS — R739 Hyperglycemia, unspecified: Secondary | ICD-10-CM | POA: Diagnosis not present

## 2020-02-24 DIAGNOSIS — M8949 Other hypertrophic osteoarthropathy, multiple sites: Secondary | ICD-10-CM | POA: Diagnosis not present

## 2020-02-24 DIAGNOSIS — Z0000 Encounter for general adult medical examination without abnormal findings: Secondary | ICD-10-CM | POA: Diagnosis not present

## 2020-02-24 DIAGNOSIS — Z130 Encounter for screening for diseases of the blood and blood-forming organs and certain disorders involving the immune mechanism: Secondary | ICD-10-CM | POA: Diagnosis not present

## 2020-02-24 DIAGNOSIS — Z791 Long term (current) use of non-steroidal anti-inflammatories (NSAID): Secondary | ICD-10-CM | POA: Diagnosis not present

## 2020-02-24 DIAGNOSIS — M65341 Trigger finger, right ring finger: Secondary | ICD-10-CM | POA: Diagnosis not present

## 2020-08-23 DIAGNOSIS — G894 Chronic pain syndrome: Secondary | ICD-10-CM | POA: Diagnosis not present

## 2020-08-23 DIAGNOSIS — F411 Generalized anxiety disorder: Secondary | ICD-10-CM | POA: Diagnosis not present

## 2020-08-23 DIAGNOSIS — F329 Major depressive disorder, single episode, unspecified: Secondary | ICD-10-CM | POA: Diagnosis not present

## 2020-08-23 DIAGNOSIS — F5101 Primary insomnia: Secondary | ICD-10-CM | POA: Diagnosis not present

## 2020-08-23 DIAGNOSIS — M159 Polyosteoarthritis, unspecified: Secondary | ICD-10-CM | POA: Diagnosis not present
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
## 2020-12-29 DIAGNOSIS — H18513 Endothelial corneal dystrophy, bilateral: Secondary | ICD-10-CM | POA: Diagnosis not present

## 2020-12-29 DIAGNOSIS — H353132 Nonexudative age-related macular degeneration, bilateral, intermediate dry stage: Secondary | ICD-10-CM | POA: Diagnosis not present

## 2020-12-29 DIAGNOSIS — H52203 Unspecified astigmatism, bilateral: Secondary | ICD-10-CM | POA: Diagnosis not present

## 2020-12-29 DIAGNOSIS — Z961 Presence of intraocular lens: Secondary | ICD-10-CM | POA: Diagnosis not present
